# Patient Record
Sex: Male | Born: 1980 | Race: White | Hispanic: No | Smoking: Former smoker
Health system: Southern US, Community
[De-identification: ages and names within clinical notes are randomized; demographics above are authoritative.]

## PROBLEM LIST (undated history)

## (undated) DIAGNOSIS — E119 Type 2 diabetes mellitus without complications: Secondary | ICD-10-CM

## (undated) HISTORY — PX: TYMPANOSTOMY TUBE PLACEMENT: SHX32

## (undated) HISTORY — PX: MOUTH SURGERY: SHX715

---

## 2001-10-07 ENCOUNTER — Emergency Department (HOSPITAL_COMMUNITY): Admission: EM | Admit: 2001-10-07 | Discharge: 2001-10-08 | Payer: Self-pay | Admitting: Emergency Medicine

## 2001-10-08 ENCOUNTER — Encounter: Payer: Self-pay | Admitting: Emergency Medicine

## 2009-07-18 ENCOUNTER — Emergency Department (HOSPITAL_COMMUNITY): Admission: EM | Admit: 2009-07-18 | Discharge: 2009-07-18 | Payer: Self-pay | Admitting: Emergency Medicine

## 2010-08-10 HISTORY — PX: ESOPHAGOGASTRODUODENOSCOPY: SHX1529

## 2011-02-20 ENCOUNTER — Other Ambulatory Visit: Payer: Self-pay | Admitting: Gastroenterology

## 2011-02-25 ENCOUNTER — Ambulatory Visit
Admission: RE | Admit: 2011-02-25 | Discharge: 2011-02-25 | Disposition: A | Discharge status: Home / Self Care | Payer: BC Managed Care – PPO | Source: Ambulatory Visit | Attending: Gastroenterology | Admitting: Gastroenterology

## 2013-12-19 ENCOUNTER — Other Ambulatory Visit: Payer: Self-pay | Admitting: Gastroenterology

## 2013-12-19 DIAGNOSIS — R1310 Dysphagia, unspecified: Secondary | ICD-10-CM

## 2013-12-22 ENCOUNTER — Ambulatory Visit
Admission: RE | Admit: 2013-12-22 | Discharge: 2013-12-22 | Disposition: A | Discharge status: Home / Self Care | Payer: BC Managed Care – PPO | Source: Ambulatory Visit | Attending: Gastroenterology | Admitting: Gastroenterology

## 2013-12-22 DIAGNOSIS — R1310 Dysphagia, unspecified: Secondary | ICD-10-CM

## 2015-12-03 IMAGING — RF DG ESOPHAGUS
19 of 24 series · 19 of 24 positions shown · non-contrast
Comparison: None.

CLINICAL DATA: Dysphagia

EXAM:
ESOPHOGRAM / BARIUM SWALLOW / BARIUM TABLET STUDY
TECHNIQUE: Combined double contrast and single contrast examination performed
using effervescent crystals, thick barium liquid, and thin barium
liquid. The patient was observed with fluoroscopy swallowing a 13mm
barium sulphate tablet.
FLUOROSCOPY TIME:  1 min, 24 seconds

[Series 1: run · 1 of 5 slices shown (1 of 19)]
[im 1/5]
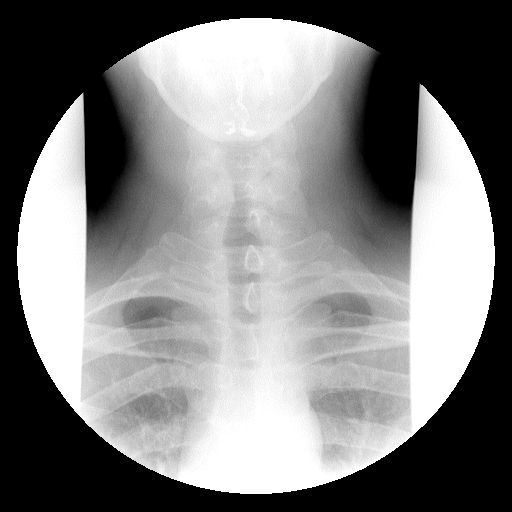

[Series 2: run · 1 of 5 slices shown (2 of 19)]
[im 1/5]
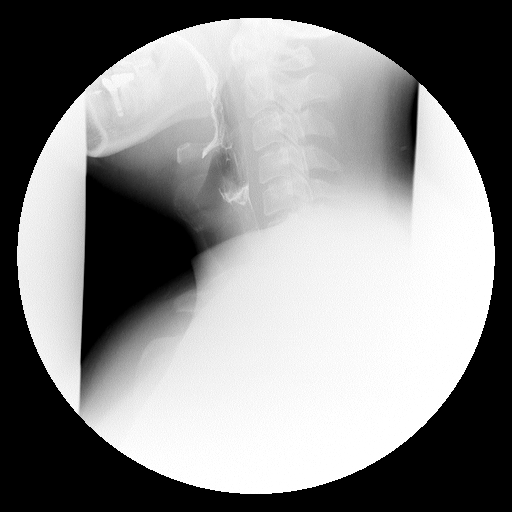

[Series 4: run · 1 of 1 slices shown (3 of 19)]
[im 1/1]
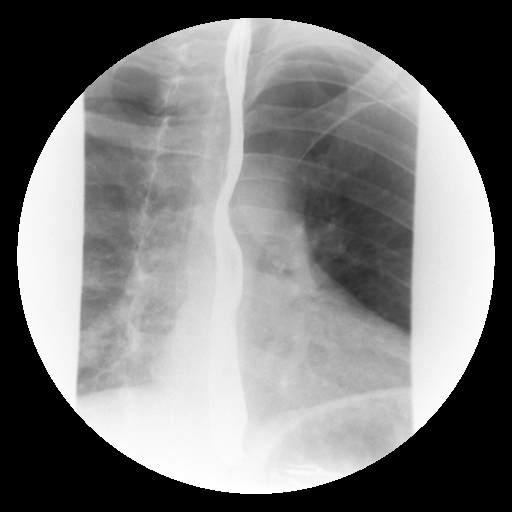

[Series 5: run · 1 of 1 slices shown (4 of 19)]
[im 1/1]
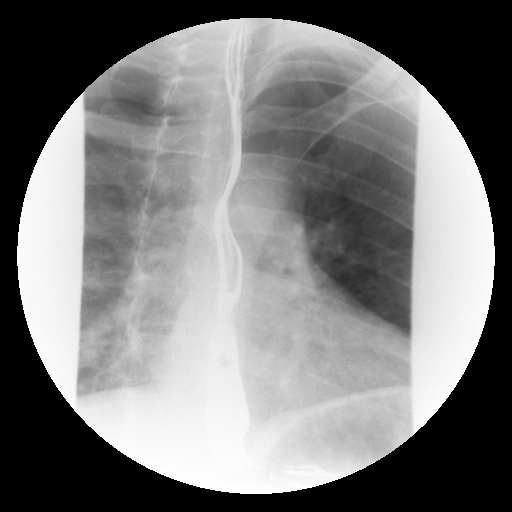

[Series 6: run · 1 of 1 slices shown (5 of 19)]
[im 1/1]
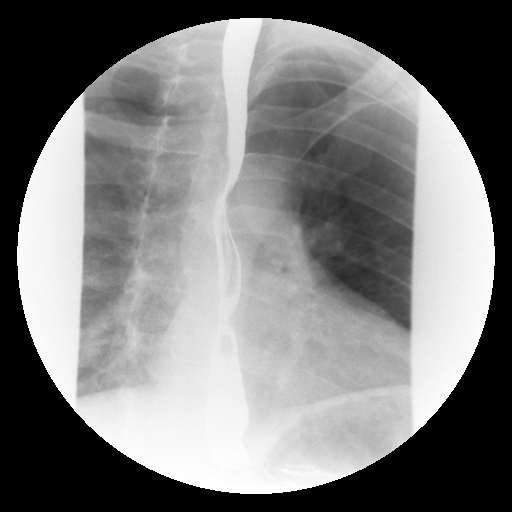

[Series 7: run · 1 of 1 slices shown (6 of 19)]
[im 1/1]
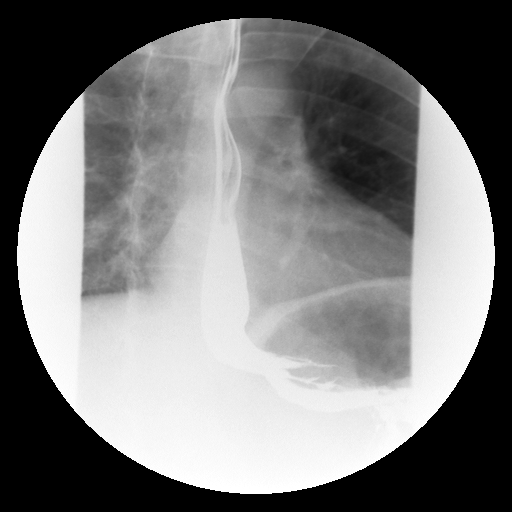

[Series 9: run · 1 of 1 slices shown (7 of 19)]
[im 1/1]
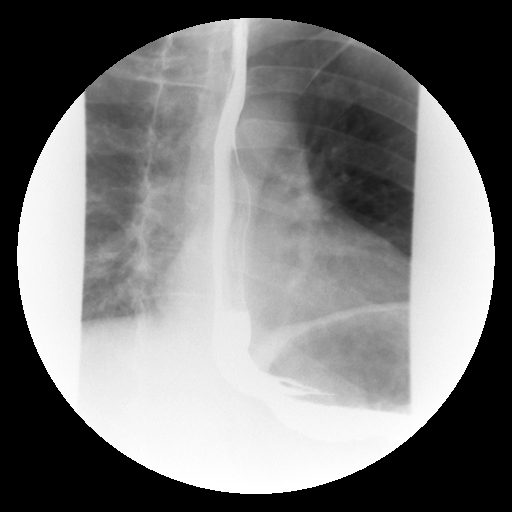

[Series 10: run · 1 of 1 slices shown (8 of 19)]
[im 1/1]
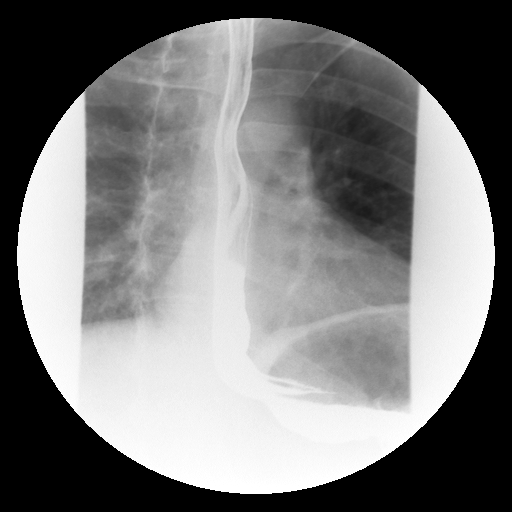

[Series 11: run · 1 of 3 slices shown (9 of 19)]
[im 1/3]
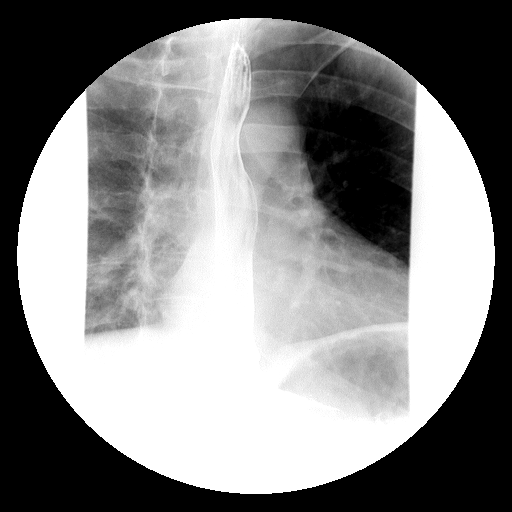

[Series 13: run · 1 of 1 slices shown (10 of 19)]
[im 1/1]
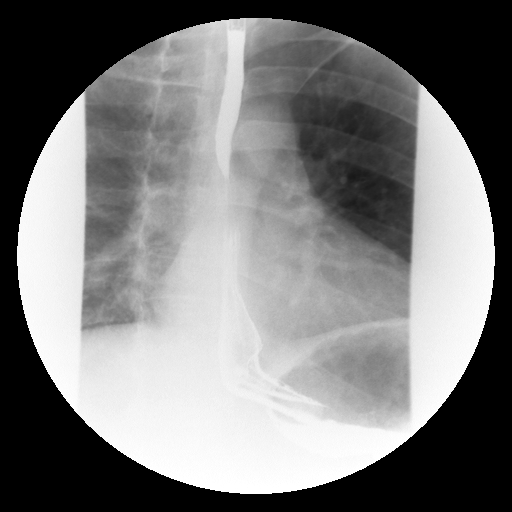

[Series 14: run · 1 of 2 slices shown (11 of 19)]
[im 1/2]
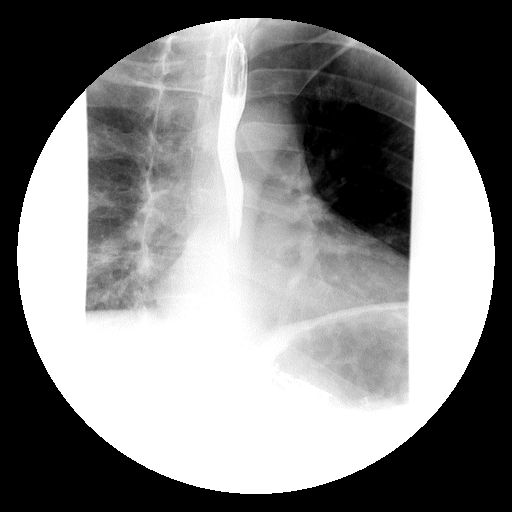

[Series 15: run · 1 of 1 slices shown (12 of 19)]
[im 1/1]
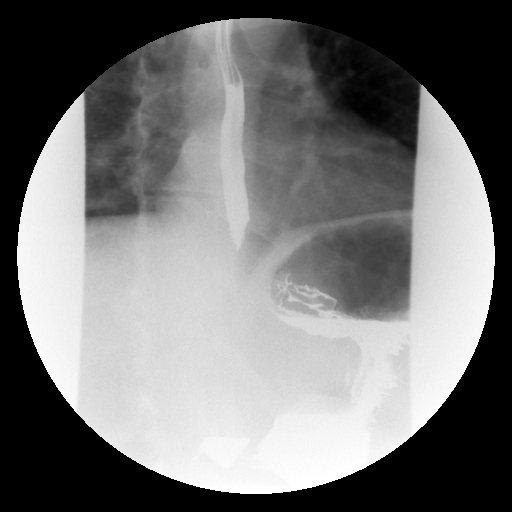

[Series 16: run · 1 of 1 slices shown (13 of 19)]
[im 1/1]
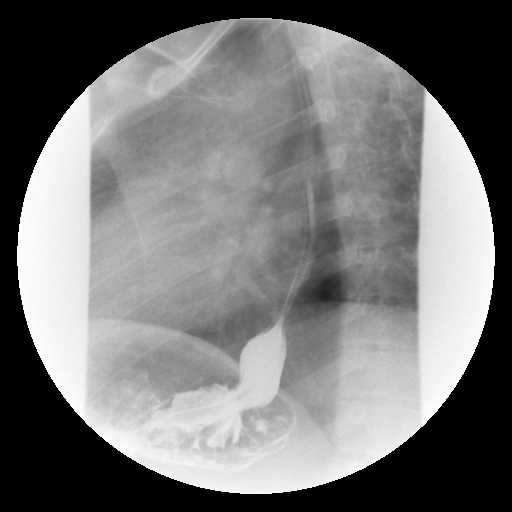

[Series 18: run · 1 of 1 slices shown (14 of 19)]
[im 1/1]
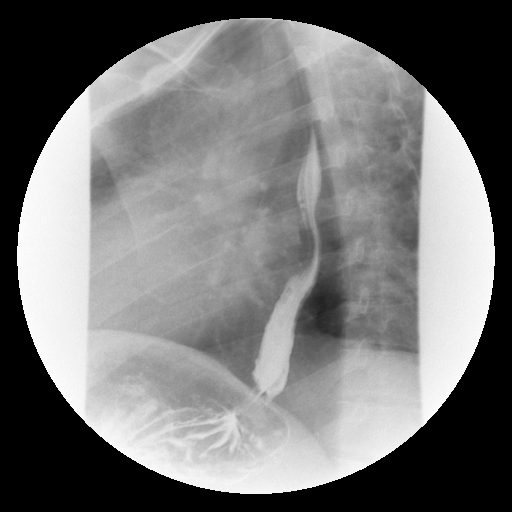

[Series 19: run · 1 of 1 slices shown (15 of 19)]
[im 1/1]
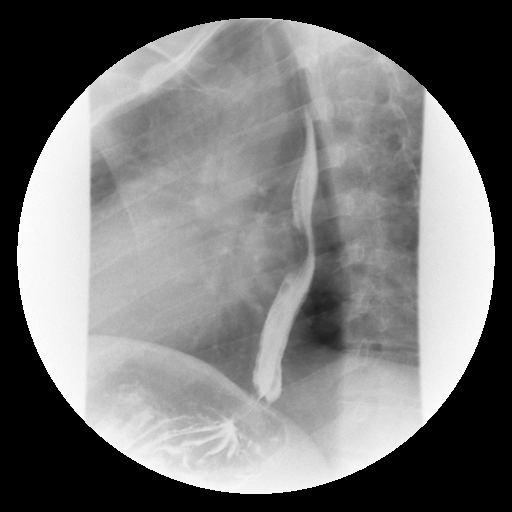

[Series 20: run · 1 of 1 slices shown (16 of 19)]
[im 1/1]
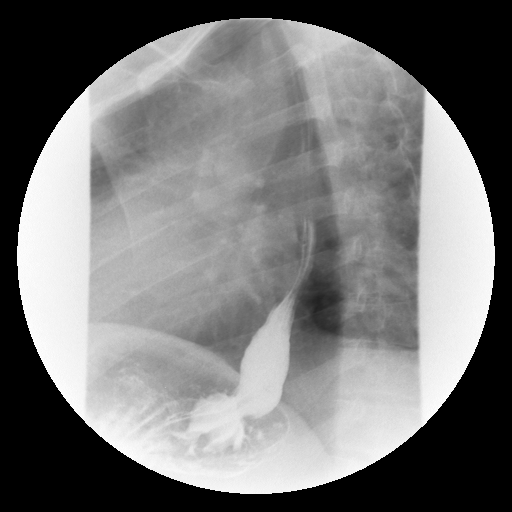

[Series 21: run · 1 of 1 slices shown (17 of 19)]
[im 1/1]
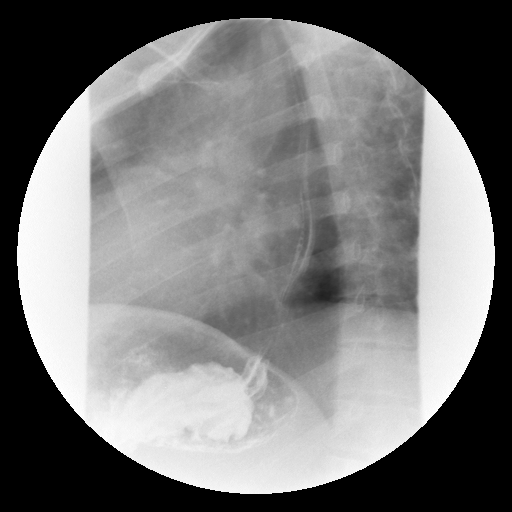

[Series 23: run · 1 of 1 slices shown (18 of 19)]
[im 1/1]
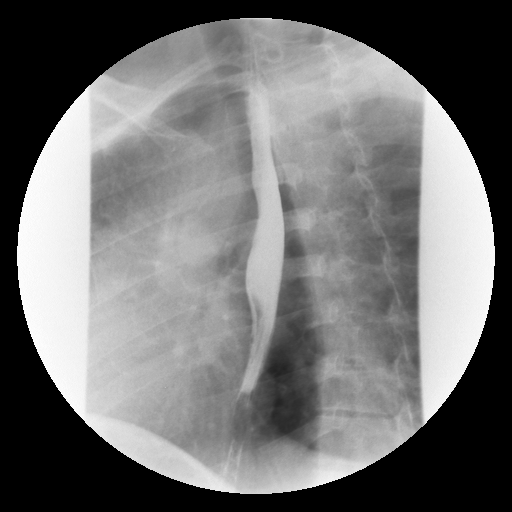

[Series 24: run · 1 of 1 slices shown (19 of 19)]
[im 1/1]
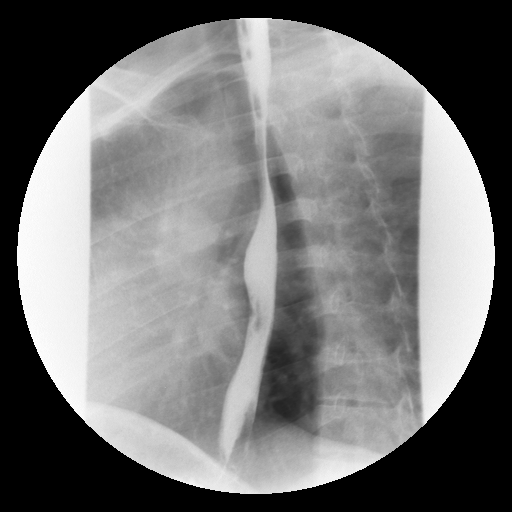

[19 of 24 positions shown; findings below may reference images not displayed]

FINDINGS: Fluoroscopic evaluation of swallowing demonstrates normal cervical
esophagus. No fixed stricture, fold thickening or mass. Normal
primary peristaltic waves. No reflux with the water siphon maneuver.
The patient swallowed a 13 mm barium tablet which freely passed into
the stomach.
IMPRESSION: Unremarkable barium swallow.

## 2019-11-23 ENCOUNTER — Ambulatory Visit: Payer: Self-pay

## 2023-06-14 ENCOUNTER — Other Ambulatory Visit: Payer: Self-pay

## 2023-06-14 ENCOUNTER — Inpatient Hospital Stay (HOSPITAL_COMMUNITY)
Admission: EM | Admit: 2023-06-14 | Discharge: 2023-06-17 | DRG: 638 | Disposition: A | Discharge status: Home / Self Care | Payer: No Typology Code available for payment source | Attending: Internal Medicine | Admitting: Internal Medicine

## 2023-06-14 ENCOUNTER — Encounter (HOSPITAL_COMMUNITY): Payer: Self-pay

## 2023-06-14 DIAGNOSIS — Z87891 Personal history of nicotine dependence: Secondary | ICD-10-CM

## 2023-06-14 DIAGNOSIS — E871 Hypo-osmolality and hyponatremia: Secondary | ICD-10-CM | POA: Diagnosis present

## 2023-06-14 DIAGNOSIS — R030 Elevated blood-pressure reading, without diagnosis of hypertension: Secondary | ICD-10-CM | POA: Diagnosis present

## 2023-06-14 DIAGNOSIS — F909 Attention-deficit hyperactivity disorder, unspecified type: Secondary | ICD-10-CM | POA: Diagnosis present

## 2023-06-14 DIAGNOSIS — H547 Unspecified visual loss: Secondary | ICD-10-CM | POA: Diagnosis present

## 2023-06-14 DIAGNOSIS — Z79899 Other long term (current) drug therapy: Secondary | ICD-10-CM

## 2023-06-14 DIAGNOSIS — E663 Overweight: Secondary | ICD-10-CM | POA: Diagnosis present

## 2023-06-14 DIAGNOSIS — E1110 Type 2 diabetes mellitus with ketoacidosis without coma: Principal | ICD-10-CM | POA: Diagnosis present

## 2023-06-14 DIAGNOSIS — Z833 Family history of diabetes mellitus: Secondary | ICD-10-CM

## 2023-06-14 DIAGNOSIS — D649 Anemia, unspecified: Secondary | ICD-10-CM | POA: Diagnosis present

## 2023-06-14 DIAGNOSIS — E1310 Other specified diabetes mellitus with ketoacidosis without coma: Principal | ICD-10-CM

## 2023-06-14 DIAGNOSIS — Z8249 Family history of ischemic heart disease and other diseases of the circulatory system: Secondary | ICD-10-CM

## 2023-06-14 DIAGNOSIS — K219 Gastro-esophageal reflux disease without esophagitis: Secondary | ICD-10-CM | POA: Diagnosis present

## 2023-06-14 DIAGNOSIS — Z6826 Body mass index (BMI) 26.0-26.9, adult: Secondary | ICD-10-CM

## 2023-06-14 DIAGNOSIS — R7989 Other specified abnormal findings of blood chemistry: Secondary | ICD-10-CM | POA: Diagnosis present

## 2023-06-14 DIAGNOSIS — N179 Acute kidney failure, unspecified: Secondary | ICD-10-CM | POA: Diagnosis present

## 2023-06-14 DIAGNOSIS — E8809 Other disorders of plasma-protein metabolism, not elsewhere classified: Secondary | ICD-10-CM | POA: Diagnosis present

## 2023-06-14 DIAGNOSIS — E876 Hypokalemia: Secondary | ICD-10-CM | POA: Diagnosis present

## 2023-06-14 HISTORY — DX: Type 2 diabetes mellitus without complications: E11.9

## 2023-06-14 LAB — BASIC METABOLIC PANEL
Anion gap: 17 — ABNORMAL HIGH (ref 5–15)
Anion gap: 12 (ref 5–15)
Anion gap: 12 (ref 5–15)
Anion gap: 9 (ref 5–15)
BUN: 13 mg/dL (ref 6–20)
BUN: 13 mg/dL (ref 6–20)
BUN: 13 mg/dL (ref 6–20)
BUN: 11 mg/dL (ref 6–20)
CO2: 13 mmol/L — ABNORMAL LOW (ref 22–32)
CO2: 14 mmol/L — ABNORMAL LOW (ref 22–32)
CO2: 14 mmol/L — ABNORMAL LOW (ref 22–32)
CO2: 15 mmol/L — ABNORMAL LOW (ref 22–32)
Calcium: 9.3 mg/dL (ref 8.9–10.3)
Calcium: 8.4 mg/dL — ABNORMAL LOW (ref 8.9–10.3)
Calcium: 8.4 mg/dL — ABNORMAL LOW (ref 8.9–10.3)
Calcium: 8.4 mg/dL — ABNORMAL LOW (ref 8.9–10.3)
Chloride: 100 mmol/L (ref 98–111)
Chloride: 105 mmol/L (ref 98–111)
Chloride: 107 mmol/L (ref 98–111)
Chloride: 111 mmol/L (ref 98–111)
Creatinine, Ser: 0.96 mg/dL (ref 0.61–1.24)
Creatinine, Ser: 1 mg/dL (ref 0.61–1.24)
Creatinine, Ser: 0.68 mg/dL (ref 0.61–1.24)
Creatinine, Ser: 0.6 mg/dL — ABNORMAL LOW (ref 0.61–1.24)
GFR, Estimated: >60 mL/min (ref >60)
GFR, Estimated: >60 mL/min (ref >60)
GFR, Estimated: >60 mL/min (ref >60)
GFR, Estimated: >60 mL/min (ref >60)
Glucose, Bld: 370 mg/dL — ABNORMAL HIGH (ref 70–99)
Glucose, Bld: 307 mg/dL — ABNORMAL HIGH (ref 70–99)
Glucose, Bld: 129 mg/dL — ABNORMAL HIGH (ref 70–99)
Glucose, Bld: 148 mg/dL — ABNORMAL HIGH (ref 70–99)
Potassium: 3.8 mmol/L (ref 3.5–5.1)
Potassium: 4 mmol/L (ref 3.5–5.1)
Potassium: 3.5 mmol/L (ref 3.5–5.1)
Potassium: 4.2 mmol/L (ref 3.5–5.1)
Sodium: 130 mmol/L — ABNORMAL LOW (ref 135–145)
Sodium: 131 mmol/L — ABNORMAL LOW (ref 135–145)
Sodium: 133 mmol/L — ABNORMAL LOW (ref 135–145)
Sodium: 135 mmol/L (ref 135–145)

## 2023-06-14 LAB — MRSA NEXT GEN BY PCR, NASAL: MRSA by PCR Next Gen: NOT DETECTED

## 2023-06-14 LAB — CBC WITH DIFFERENTIAL/PLATELET
Abs Immature Granulocytes: 0.03 10*3/uL (ref 0.00–0.07)
Basophils Absolute: 0 10*3/uL (ref 0.0–0.1)
Basophils Relative: 1 %
Eosinophils Absolute: 0.1 10*3/uL (ref 0.0–0.5)
Eosinophils Relative: 2 %
HCT: 42.6 % (ref 39.0–52.0)
Hemoglobin: 14.7 g/dL (ref 13.0–17.0)
Immature Granulocytes: 1 %
Lymphocytes Relative: 39 %
Lymphs Abs: 2.4 10*3/uL (ref 0.7–4.0)
MCH: 31.4 pg (ref 26.0–34.0)
MCHC: 34.5 g/dL (ref 30.0–36.0)
MCV: 91 fL (ref 80.0–100.0)
Monocytes Absolute: 0.4 10*3/uL (ref 0.1–1.0)
Monocytes Relative: 7 %
Neutro Abs: 3.1 10*3/uL (ref 1.7–7.7)
Neutrophils Relative %: 50 %
Platelets: 250 10*3/uL (ref 150–400)
RBC: 4.68 MIL/uL (ref 4.22–5.81)
RDW: 12.6 % (ref 11.5–15.5)
WBC: 6 10*3/uL (ref 4.0–10.5)
nRBC: 0 % (ref 0.0–0.2)

## 2023-06-14 LAB — GLUCOSE, CAPILLARY
Glucose-Capillary: 172 mg/dL — ABNORMAL HIGH (ref 70–99)
Glucose-Capillary: 138 mg/dL — ABNORMAL HIGH (ref 70–99)
Glucose-Capillary: 151 mg/dL — ABNORMAL HIGH (ref 70–99)
Glucose-Capillary: 150 mg/dL — ABNORMAL HIGH (ref 70–99)
Glucose-Capillary: 146 mg/dL — ABNORMAL HIGH (ref 70–99)
Glucose-Capillary: 147 mg/dL — ABNORMAL HIGH (ref 70–99)
Glucose-Capillary: 139 mg/dL — ABNORMAL HIGH (ref 70–99)
Glucose-Capillary: 146 mg/dL — ABNORMAL HIGH (ref 70–99)

## 2023-06-14 LAB — BLOOD GAS, VENOUS
Acid-base deficit: 11.5 mmol/L — ABNORMAL HIGH (ref 0.0–2.0)
Bicarbonate: 15 mmol/L — ABNORMAL LOW (ref 20.0–28.0)
O2 Saturation: 33.4 %
Patient temperature: 37
pCO2, Ven: 35 mm[Hg] — ABNORMAL LOW (ref 44–60)
pH, Ven: 7.24 — ABNORMAL LOW (ref 7.25–7.43)
pO2, Ven: <31 mm[Hg] — CL (ref 32–45)

## 2023-06-14 LAB — URINALYSIS, ROUTINE W REFLEX MICROSCOPIC
Bacteria, UA: NONE SEEN
Bilirubin Urine: NEGATIVE
Glucose, UA: >=500 mg/dL — AB
Ketones, ur: 80 mg/dL — AB
Leukocytes,Ua: NEGATIVE
Nitrite: NEGATIVE
Protein, ur: 30 mg/dL — AB
Specific Gravity, Urine: 1.029 (ref 1.005–1.030)
pH: 5 (ref 5.0–8.0)

## 2023-06-14 LAB — HEPATIC FUNCTION PANEL
ALT: 11 U/L (ref 0–44)
AST: 12 U/L — ABNORMAL LOW (ref 15–41)
Albumin: 3.4 g/dL — ABNORMAL LOW (ref 3.5–5.0)
Alkaline Phosphatase: 78 U/L (ref 38–126)
Bilirubin, Direct: <0.1 mg/dL (ref 0.0–0.2)
Total Bilirubin: 1.1 mg/dL (ref <1.2)
Total Protein: 6.2 g/dL — ABNORMAL LOW (ref 6.5–8.1)

## 2023-06-14 LAB — HEMOGLOBIN A1C
Hgb A1c MFr Bld: 14.5 % — ABNORMAL HIGH (ref 4.8–5.6)
Mean Plasma Glucose: 369.45 mg/dL

## 2023-06-14 LAB — BETA-HYDROXYBUTYRIC ACID
Beta-Hydroxybutyric Acid: 6.63 mmol/L — ABNORMAL HIGH (ref 0.05–0.27)
Beta-Hydroxybutyric Acid: 3.77 mmol/L — ABNORMAL HIGH (ref 0.05–0.27)

## 2023-06-14 LAB — LIPASE, BLOOD: Lipase: 35 U/L (ref 11–51)

## 2023-06-14 LAB — CBG MONITORING, ED: Glucose-Capillary: 351 mg/dL — ABNORMAL HIGH (ref 70–99)

## 2023-06-14 MED ORDER — DEXTROSE IN LACTATED RINGERS 5 % IV SOLN: INTRAVENOUS | Status: DC

## 2023-06-14 MED ORDER — ENOXAPARIN SODIUM 40 MG/0.4ML IJ SOSY
40.0000 mg | PREFILLED_SYRINGE | Freq: Every day | INTRAMUSCULAR | Status: DC
  Administered 2023-06-14 – 2023-06-16 (×3): 40 mg via SUBCUTANEOUS
  Filled 2023-06-14 (×3): qty 0.4

## 2023-06-14 MED ORDER — CHLORHEXIDINE GLUCONATE CLOTH 2 % EX PADS
6.0000 | MEDICATED_PAD | Freq: Every day | CUTANEOUS | Status: DC
  Administered 2023-06-14 – 2023-06-15 (×2): 6 via TOPICAL

## 2023-06-14 MED ORDER — LACTATED RINGERS IV BOLUS
20.0000 mL/kg | Freq: Once | INTRAVENOUS | Status: AC
  Administered 2023-06-14: 1696 mL via INTRAVENOUS

## 2023-06-14 MED ORDER — LACTATED RINGERS IV SOLN: INTRAVENOUS | Status: DC

## 2023-06-14 MED ORDER — POTASSIUM CHLORIDE CRYS ER 20 MEQ PO TBCR
40.0000 meq | EXTENDED_RELEASE_TABLET | Freq: Once | ORAL | Status: AC
  Administered 2023-06-14: 40 meq via ORAL
  Filled 2023-06-14: qty 2

## 2023-06-14 MED ORDER — LACTATED RINGERS IV SOLN: INTRAVENOUS | Status: AC

## 2023-06-14 MED ORDER — PANTOPRAZOLE SODIUM 40 MG IV SOLR
40.0000 mg | Freq: Once | INTRAVENOUS | Status: AC
  Administered 2023-06-14: 40 mg via INTRAVENOUS
  Filled 2023-06-14: qty 10

## 2023-06-14 MED ORDER — ORAL CARE MOUTH RINSE: 15.0000 mL | OROMUCOSAL | Status: DC | PRN

## 2023-06-14 MED ORDER — METOPROLOL TARTRATE 5 MG/5ML IV SOLN
5.0000 mg | Freq: Once | INTRAVENOUS | Status: AC
  Administered 2023-06-14: 5 mg via INTRAVENOUS
  Filled 2023-06-14: qty 5

## 2023-06-14 MED ORDER — INSULIN REGULAR(HUMAN) IN NACL 100-0.9 UT/100ML-% IV SOLN
INTRAVENOUS | Status: DC
  Administered 2023-06-14: 18 [IU]/h via INTRAVENOUS
  Filled 2023-06-14: qty 100

## 2023-06-14 MED ORDER — ONDANSETRON HCL 4 MG/2ML IJ SOLN: 4.0000 mg | Freq: Four times a day (QID) | INTRAMUSCULAR | Status: DC | PRN

## 2023-06-14 MED ORDER — POTASSIUM CHLORIDE 10 MEQ/100ML IV SOLN
10.0000 meq | INTRAVENOUS | Status: DC
Start: 2023-06-14 — End: 2023-06-14

## 2023-06-14 MED ORDER — DEXTROSE IN LACTATED RINGERS 5 % IV SOLN: INTRAVENOUS | Status: AC

## 2023-06-14 MED ORDER — DEXTROSE 50 % IV SOLN: 0.0000 mL | INTRAVENOUS | Status: DC | PRN

## 2023-06-14 MED ORDER — ACETAMINOPHEN 650 MG RE SUPP: 650.0000 mg | Freq: Four times a day (QID) | RECTAL | Status: DC | PRN

## 2023-06-14 MED ORDER — POTASSIUM CHLORIDE 10 MEQ/100ML IV SOLN
10.0000 meq | INTRAVENOUS | Status: DC
  Administered 2023-06-14 (×2): 10 meq via INTRAVENOUS
  Filled 2023-06-14: qty 100

## 2023-06-14 MED ORDER — PANTOPRAZOLE SODIUM 40 MG PO TBEC
40.0000 mg | DELAYED_RELEASE_TABLET | Freq: Every day | ORAL | Status: DC
  Administered 2023-06-16: 40 mg via ORAL
  Filled 2023-06-14 (×2): qty 1

## 2023-06-14 MED ORDER — ONDANSETRON HCL 4 MG PO TABS: 4.0000 mg | ORAL_TABLET | Freq: Four times a day (QID) | ORAL | Status: DC | PRN

## 2023-06-14 MED ORDER — ACETAMINOPHEN 325 MG PO TABS
650.0000 mg | ORAL_TABLET | Freq: Four times a day (QID) | ORAL | Status: DC | PRN
  Administered 2023-06-14: 650 mg via ORAL
  Filled 2023-06-14: qty 2

## 2023-06-14 NOTE — ED Triage Notes (Signed)
Sent from providers office for hyponatremia 122 on 06/10/23. Newly dx DM with A1C above 14. Not being treated yet for DM.

## 2023-06-14 NOTE — ED Provider Notes (Signed)
[place] EMERGENCY DEPARTMENT AT [facility] Provider Note   CSN: [id number] Arrival date & time: 06/14/23  1121     History  No chief complaint on file.   Warren Cuevas is a 42 y.o. male with no pertinent past medical history presented for abnormal lab.  Patient states that he went to go see his primary care few days ago and was diagnosed with diabetes and has not taken any medications for this.  Patient has over the past few weeks has had polyuria, polydipsia, polyphasia.  Patient does endorse fatigue as well.  Patient denies abdominal pain, nausea/vomiting, change in sensation of smell skills, seizure-like activity, fevers, chest pain, shortness of breath.   Home Medications Prior to Admission medications   Not on File      Allergies    Patient has no known allergies.    Review of Systems   Review of Systems  Physical Exam Updated Vital Signs BP 129/87   Pulse 88   Temp 98.3 F (36.8 C) (Oral)   Resp 13   Ht 5\' 11"  (1.803 m)   Wt 84.8 kg   SpO2 98%   BMI 26.08 kg/m  Physical Exam Vitals reviewed.  Constitutional:      General: He is not in acute distress. HENT:     Head: Normocephalic and atraumatic.     Mouth/Throat:     Mouth: Mucous membranes are dry.  Eyes:     Extraocular Movements: Extraocular movements intact.     Conjunctiva/sclera: Conjunctivae normal.     Pupils: Pupils are equal, round, and reactive to light.  Cardiovascular:     Rate and Rhythm: Normal rate and regular rhythm.     Pulses: Normal pulses.     Heart sounds: Normal heart sounds.     Comments: 2+ bilateral radial/dorsalis pedis pulses with regular rate Pulmonary:     Effort: Pulmonary effort is normal. No respiratory distress.     Breath sounds: Normal breath sounds.  Abdominal:     Palpations: Abdomen is soft.     Tenderness: There is no abdominal tenderness. There is no guarding or rebound.  Musculoskeletal:        General: Normal range of motion.      Cervical back: Normal range of motion and neck supple.     Comments: 5 out of 5 bilateral grip/leg extension strength  Skin:    General: Skin is warm and dry.     Capillary Refill: Capillary refill takes less than 2 seconds.  Neurological:     General: No focal deficit present.     Mental Status: He is alert and oriented to person, place, and time.     Comments: Sensation intact in all 4 limbs  Psychiatric:        Mood and Affect: Mood normal.     ED Results / Procedures / Treatments   Labs (all labs ordered are listed, but only abnormal results are displayed) Labs Reviewed  BASIC METABOLIC PANEL - Abnormal; Notable for the following components:      Result Value   Sodium 130 (*)    CO2 13 (*)    Glucose, Bld 370 (*)    Anion gap 17 (*)    All other components within normal limits  BLOOD GAS, VENOUS - Abnormal; Notable for the following components:   pH, Ven 7.24 (*)    pCO2, Ven 35 (*)    pO2, Ven <31 (*)    Bicarbonate 15.0 (*)  Acid-base deficit 11.5 (*)    All other components within normal limits  CBG MONITORING, ED - Abnormal; Notable for the following components:   Glucose-Capillary 351 (*)    All other components within normal limits  CBC WITH DIFFERENTIAL/PLATELET  LIPASE, BLOOD  BETA-HYDROXYBUTYRIC ACID  BETA-HYDROXYBUTYRIC ACID  URINALYSIS, ROUTINE W REFLEX MICROSCOPIC  CBG MONITORING, ED    EKG EKG Interpretation Date/Time:  Monday June 14 2023 12:08:09 EST Ventricular Rate:  82 PR Interval:  140 QRS Duration:  94 QT Interval:  378 QTC Calculation: 442 R Axis:   36  Text Interpretation: Sinus rhythm Low voltage, precordial leads Probable anteroseptal infarct, old Borderline T abnormalities, inferior leads Baseline wander in lead(s) V3 Confirmed by Kristine Royal [phone]) on 06/14/2023 12:12:56 PM  Radiology No results found.  Procedures .Critical Care  Performed by: Netta Corrigan, PA-C Authorized by: Netta Corrigan, PA-C   Critical  care provider statement:    Critical care time (minutes):  30   Critical care time was exclusive of:  Separately billable procedures and treating other patients   Critical care was necessary to treat or prevent imminent or life-threatening deterioration of the following conditions:  Metabolic crisis   Critical care was time spent personally by me on the following activities:  Development of treatment plan with patient or surrogate, discussions with consultants, evaluation of patient's response to treatment, examination of patient, ordering and review of laboratory studies, ordering and review of radiographic studies, ordering and performing treatments and interventions, pulse oximetry, re-evaluation of patient's condition, review of old charts, blood draw for specimens and obtaining history from patient or surrogate   I assumed direction of critical care for this patient from another provider in my specialty: no     Care discussed with: admitting provider       Medications Ordered in ED Medications  dextrose 50 % solution 0-50 mL (has no administration in time range)  lactated ringers bolus 1,696 mL (1,696 mLs Intravenous New Bag/Given 06/14/23 1256)    ED Course/ Medical Decision Making/ A&P                                 Medical Decision Making Amount and/or Complexity of Data Reviewed Labs: ordered.  Risk Prescription drug management.   Warren Cuevas 42 y.o. presented today for abnormal labs.  Working DDx that I considered at this time includes, but not limited to, hyperglycemia, DKA/HHS, electrolyte abnormalities, occult infection, euglycemic DKA, sepsis, hyponatremia, electrolyte abnormalities.  R/o DDx: HHS, occult infection, euglycemic DKA, sepsis, hyponatremia, electrolyte abnormalities: These are considered less likely due to history of present illness, physical exam, labs/imaging findings  Review of prior external notes: None  Unique Tests and My  Interpretation:  CBG: 351 CBC: Unremarkable CMP: Hyponatremia 130, glucose 370, anion gap 17, low CO2 13 Lipase: 35 Beta-hydroxybutyrate: pending VBG: pH 7.24 UA: pending EKG: Sinus 82 bpm, no ST elevations or depressions noted, no blocks noted  Social Determinants of Health: none  Discussion with Independent Historian:  Significant other  Discussion of Management of Tests:  Warren Matar, MD Hospitalist  Risk: High: hospitalization or escalation of hospital-level care  Risk Stratification Score: none  Staffed with Messick, MD  Plan: On exam patient was in no acute distress stable vitals.  Patient is exam does show that his oromucosa is dry but otherwise is reassuring.  Labs do show sodium 130 however glucose is 370 and so  this is most likely pseudo hyponatremia.  Patient does have anion gap of 17 which is most likely secondary to the hyperglycemia and so with patient being recently diagnosed with diabetes patient could be in DKA and so will obtain DKA labs and start fluids on the patient.  Anticipate admission if in DKA.  Patient's VBG did come back showing the patient was acidotic.  Given patient's hyperglycemia with acidosis I do have a high suspicion patient is in DKA and and will need hospital admission as he is not currently on any medications for his diabetes as he is a brand-new diabetic.  Spoke to the patient he is in agreement with admission.  Will consult hospitalist for admission.  Hospitalist accepted patient for admission.  Patient stable for admission at this time.  This chart was dictated using voice recognition software.  Despite best efforts to proofread,  errors can occur which can change the documentation meaning.         Final Clinical Impression(s) / ED Diagnoses Final diagnoses:  Diabetic ketoacidosis without coma associated with other specified diabetes mellitus [facility])    Rx / DC Orders ED Discharge Orders     None         Remi Deter 06/14/23 1340    Wynetta Fines, MD 06/14/23 1524

## 2023-06-14 NOTE — ED Notes (Signed)
ED TO INPATIENT HANDOFF REPORT  ED Nurse Name and Phone #: Cat   S Name/Age/Gender Warren Cuevas 42 y.o. male Room/Bed: WA12/WA12  Code Status   Code Status: Not on file  Home/SNF/Other Home Patient oriented to: self, place, time, and situation Is this baseline? Yes   Triage Complete: Triage complete  Chief Complaint DKA, type 2 (HCC) [E11.10]  Triage Note Sent from providers office for hyponatremia 122 on 06/10/23. Newly dx DM with A1C above 14. Not being treated yet for DM.   Allergies No Known Allergies  Level of Care/Admitting Diagnosis ED Disposition     ED Disposition  Admit   Condition  --   Comment  Hospital Area: [facility] [[id number]]  Level of Care: Stepdown [14]  Admit to SDU based on following criteria: Severe physiological/psychological symptoms:  Any diagnosis requiring assessment & intervention at least every 4 hours on an ongoing basis to obtain desired patient outcomes including stability and rehabilitation  May place patient in observation at [facility] or [facility] if equivalent level of care is available:: No  Covid Evaluation: Asymptomatic - no recent exposure (last 10 days) testing not required  Diagnosis: DKA, type 2 [facility]) [[id number]]  Admitting Physician: Bobette Mo [[id number]]  Attending Physician: Bobette Mo [[id number]]          B Medical/Surgery History Past Medical History:  Diagnosis Date   Diabetes mellitus without complication (HCC)       A IV Location/Drains/Wounds Patient Lines/Drains/Airways Status     Active Line/Drains/Airways     Name Placement date Placement time Site Days   Peripheral IV 06/14/23 20 G Anterior;Distal;Left;Upper Arm 06/14/23  1145  Arm  less than 1            Intake/Output Last 24 hours No intake or output data in the 24 hours ending 06/14/23 1348  Labs/Imaging Results for orders placed or performed during the hospital encounter of 06/14/23  (from the past 48 hour(s))  Basic metabolic panel     Status: Abnormal   Collection Time: 06/14/23 11:40 AM  Result Value Ref Range   Sodium 130 (L) 135 - 145 mmol/L   Potassium 3.8 3.5 - 5.1 mmol/L   Chloride 100 98 - 111 mmol/L   CO2 13 (L) 22 - 32 mmol/L   Glucose, Bld 370 (H) 70 - 99 mg/dL    Comment: Glucose reference range applies only to samples taken after fasting for at least 8 hours.   BUN 13 6 - 20 mg/dL   Creatinine, Ser 2.95 0.61 - 1.24 mg/dL   Calcium 9.3 8.9 - 62.1 mg/dL   GFR, Estimated >30 >86 mL/min    Comment: (NOTE) Calculated using the CKD-EPI Creatinine Equation (2021)    Anion gap 17 (H) 5 - 15    Comment: Performed at [facility], [address] [place], [place] [address]  CBC with Differential     Status: None   Collection Time: 06/14/23 11:40 AM  Result Value Ref Range   WBC 6.0 4.0 - 10.5 K/uL   RBC 4.68 4.22 - 5.81 MIL/uL   Hemoglobin 14.7 13.0 - 17.0 g/dL   HCT 96.2 95.2 - 84.1 %   MCV 91.0 80.0 - 100.0 fL   MCH 31.4 26.0 - 34.0 pg   MCHC 34.5 30.0 - 36.0 g/dL   RDW 32.4 40.1 - 02.7 %   Platelets 250 150 - 400 K/uL   nRBC 0.0 0.0 - 0.2 %  Neutrophils Relative % 50 %   Neutro Abs 3.1 1.7 - 7.7 K/uL   Lymphocytes Relative 39 %   Lymphs Abs 2.4 0.7 - 4.0 K/uL   Monocytes Relative 7 %   Monocytes Absolute 0.4 0.1 - 1.0 K/uL   Eosinophils Relative 2 %   Eosinophils Absolute 0.1 0.0 - 0.5 K/uL   Basophils Relative 1 %   Basophils Absolute 0.0 0.0 - 0.1 K/uL   Immature Granulocytes 1 %   Abs Immature Granulocytes 0.03 0.00 - 0.07 K/uL    Comment: Performed at [facility], [address], [place], [place] [address]  POC CBG, ED     Status: Abnormal   Collection Time: 06/14/23 11:40 AM  Result Value Ref Range   Glucose-Capillary 351 (H) 70 - 99 mg/dL    Comment: Glucose reference range applies only to samples taken after fasting for at least 8 hours.  Blood gas, venous     Status: Abnormal   Collection  Time: 06/14/23 12:57 PM  Result Value Ref Range   pH, Ven 7.24 (L) 7.25 - 7.43   pCO2, Ven 35 (L) 44 - 60 mmHg   pO2, Ven <31 (LL) 32 - 45 mmHg    Comment: CRITICAL RESULT CALLED TO, READ BACK BY AND VERIFIED WITH: NAPE, T RN AT 1323 06/14/23 TUCKER. J    Bicarbonate 15.0 (L) 20.0 - 28.0 mmol/L   Acid-base deficit 11.5 (H) 0.0 - 2.0 mmol/L   O2 Saturation 33.4 %   Patient temperature 37.0     Comment: Performed at [facility], [address], [place], [place] [address]  Lipase, blood     Status: None   Collection Time: 06/14/23 12:57 PM  Result Value Ref Range   Lipase 35 11 - 51 U/L    Comment: Performed at [facility], [address], [place], [place] [address]   No results found.  Pending Labs [employer] (From admission, onward)     Start     Ordered   06/14/23 1229  Beta-hydroxybutyric acid  (Diabetes Ketoacidosis (DKA))  Now then every 8 hours,   STAT (with URGENT occurrences)      06/14/23 1229   06/14/23 1229  Urinalysis, Routine w reflex microscopic -Urine, Clean Catch  (Diabetes Ketoacidosis (DKA))  ONCE - STAT,   URGENT       Question:  Specimen Source  Answer:  Urine, Clean Catch   06/14/23 1229            Vitals/Pain Today's Vitals   06/14/23 1126 06/14/23 1130 06/14/23 1200  BP: (!) 144/100  129/87  Pulse: 99  88  Resp: 16  13  Temp: 98.3 F (36.8 C)    TempSrc: Oral    SpO2: 100%  98%  Weight: 187 lb (84.8 kg)    Height: 5\' 11"  (1.803 m)    PainSc:  0-No pain     Isolation Precautions No active isolations  Medications Medications  dextrose 50 % solution 0-50 mL (has no administration in time range)  lactated ringers bolus 1,696 mL (1,696 mLs Intravenous New Bag/Given 06/14/23 1256)    Mobility walks     Focused Assessments    R Recommendations: See Admitting Provider Note  Report given to:   Additional Notes:

## 2023-06-14 NOTE — H&P (Signed)
History and Physical    Patient: Warren Cuevas DGL:[id number] DOB: 07/19/1981 DOA: 06/14/2023 DOS: the patient was seen and examined on 06/14/2023 PCP: Carin Hock, PA  Patient coming from: Home  Chief Complaint: Abnormal lab result.  HPI: Warren Cuevas is a 42 y.o. male with medical history significant of ADHD, GERD who was sent in by his primary care provider to the emergency department due to hyponatremia, hyperglycemia and A1c over 14% with a history of several weeks of polyuria, polydipsia, blurry vision and weight loss.  He also recently had a sore throat due to candidiasis and was treated with nystatin.  He denied fever, chills, rhinorrhea, wheezing or hemoptysis.  No chest pain, palpitations, diaphoresis, PND, orthopnea or pitting edema of the lower extremities.  Positive nausea, but no emesis, diarrhea, constipation, melena or hematochezia.  He gets occasional reflux.  No flank pain, dysuria, frequency or hematuria.  No polyuria, polydipsia, polyphagia or blurred vision.   Lab work: Urinalysis showed greater than 500 glucose, ketones of 80 and protein of 30 mg deciliter with small hemoglobin.  CBC was normal.  Unremarkable lipase.  Venous pH was 7.24, pCO2 35 and pO2 less than 31 mmHg.  Bicarbonate was 15 and acid-base esses 11.5 mmol/L.  BMP showed a sodium 130 and CO2 of 13 mmol/L with an anion gap of 17.  Glucose was 370.  The rest of the electrolytes and renal function were normal.  ED course: Initial vital signs were temperature 98.3 F, pulse 99, respirations 16, BP 144/100 mmHg O2 sat 100% on room air.  Patient received 1700 mL of LR bolus.   Review of Systems: As per HPI otherwise all other systems reviewed and are negative.  Past Medical History:  Diagnosis Date   ADHD (attention deficit hyperactivity disorder) 06/14/2023   Diabetes mellitus without complication (HCC)    GERD (gastroesophageal reflux disease) 06/14/2023   Past Surgical History:   Procedure Laterality Date   ESOPHAGOGASTRODUODENOSCOPY  2012   MOUTH SURGERY     TYMPANOSTOMY TUBE PLACEMENT Bilateral    Social History  reports that he has quit smoking. His smoking use included cigarettes. He has never used smokeless tobacco. He reports that he does not currently use alcohol. No history on file for drug use.  No Known Allergies  Family History  Problem Relation Age of Onset   Spina bifida Mother    CAD Father    Heart attack Father    Diabetes type II Other    Diabetes Mellitus II Other    Prior to Admission medications   Medication Sig Start Date End Date Taking? Authorizing Provider  amphetamine-dextroamphetamine (ADDERALL XR) 15 MG 24 hr capsule Take 15 mg by mouth every morning. 05/21/23  Yes [provider]  amphetamine-dextroamphetamine (ADDERALL) 15 MG tablet Take 1 tablet by mouth 2 (two) times daily. 06/02/23  Yes [provider]  clotrimazole (MYCELEX) 10 MG troche Take 10 mg by mouth 3 (three) times daily. 06/09/23  Yes [provider]  nystatin cream (MYCOSTATIN) Apply 1 Application topically 3 (three) times daily. 06/09/23  Yes [provider]   Physical Exam: Vitals:   06/14/23 1126 06/14/23 1200  BP: (!) 144/100 129/87  Pulse: 99 88  Resp: 16 13  Temp: 98.3 F (36.8 C)   TempSrc: Oral   SpO2: 100% 98%  Weight: 84.8 kg   Height: 5\' 11"  (1.803 m)    Physical Exam Vitals reviewed.  Constitutional:      General: He is awake.  He is not in acute distress.    Appearance: He is overweight. He is ill-appearing. He is not diaphoretic.  HENT:     Head: Normocephalic.     Nose: No rhinorrhea.     Mouth/Throat:     Mouth: Mucous membranes are dry.  Eyes:     General: No scleral icterus.    Pupils: Pupils are equal, round, and reactive to light.  Neck:     Vascular: No JVD.  Cardiovascular:     Rate and Rhythm: Normal rate and regular rhythm.     Heart sounds: S1 normal and S2 normal.  Pulmonary:      Effort: Pulmonary effort is normal.     Breath sounds: Normal breath sounds. No wheezing, rhonchi or rales.  Abdominal:     General: Bowel sounds are normal. There is no distension.     Palpations: Abdomen is soft.     Tenderness: There is no abdominal tenderness. There is no guarding.  Musculoskeletal:     Cervical back: Neck supple.     Right lower leg: No edema.     Left lower leg: No edema.  Skin:    General: Skin is warm.  Neurological:     General: No focal deficit present.     Mental Status: He is alert and oriented to person, place, and time.  Psychiatric:        Mood and Affect: Mood normal.        Behavior: Behavior normal. Behavior is cooperative.    Data Reviewed:  Results are pending, will review when available.  EKG: Vent. rate 82 BPM PR interval 140 ms QRS duration 94 ms QT/QTcB 378/442 ms P-R-T axes 46 36 -26 Sinus rhythm Low voltage, precordial leads Probable anteroseptal infarct, old Borderline T abnormalities, inferior leads Baseline wander in lead(s) V3O  Assessment and Plan: Principal Problem:   DKA, type 2 (HCC) Observation/stepdown. Keep NPO. Continue IV fluids. Continue insulin infusion. Monitor CBG closely. BMP every 4 hours. BHA every 8 hours. Replace electrolytes as needed. Consult diabetes coordinator. Transition to SQ insulin per Endo tool.  Active Problems:   Pseudohyponatremia Corrected sodium is 136 mg/L. Continue treatment as above.    GERD (gastroesophageal reflux disease) Pantoprazole 40 g IVP x 1 dose today. -Then 40 mg p.o. tomorrow.    ADHD (attention deficit hyperactivity disorder) Hold Adderall today.    Elevated blood pressure reading Has been very anxious. Metoprolol 5 mg IVP x 1 dose. If his states elevated: -Consider starting an ACE inhibitor.     Advance Care Planning:   Code Status: Full Code   Consults:   Family Communication: His spouse was at bedside.  Severity of Illness: The appropriate  patient status for this patient is OBSERVATION. Observation status is judged to be reasonable and necessary in order to provide the required intensity of service to ensure the patient's safety. The patient's presenting symptoms, physical exam findings, and initial radiographic and laboratory data in the context of their medical condition is felt to place them at decreased risk for further clinical deterioration. Furthermore, it is anticipated that the patient will be medically stable for discharge from the hospital within 2 midnights of admission.   Author: Bobette Mo, MD 06/14/2023 1:37 PM  For on call review [email or web address].   This document was prepared using Dragon voice recognition software and may contain some unintended transcription errors.

## 2023-06-14 NOTE — Plan of Care (Signed)
  Problem: Education: Goal: Ability to describe self-care measures that may prevent or decrease complications (Diabetes Survival Skills Education) will improve Outcome: Progressing   Problem: Coping: Goal: Ability to adjust to condition or change in health will improve Outcome: Progressing   Problem: Health Behavior/Discharge Planning: Goal: Ability to identify and utilize available resources and services will improve Outcome: Progressing   Problem: Nutritional: Goal: Maintenance of adequate nutrition will improve Outcome: Progressing Goal: Progress toward achieving an optimal weight will improve Outcome: Progressing   Problem: Education: Goal: Individualized Educational Video(s) Outcome: Progressing

## 2023-06-15 ENCOUNTER — Other Ambulatory Visit (HOSPITAL_COMMUNITY): Payer: Self-pay

## 2023-06-15 DIAGNOSIS — E871 Hypo-osmolality and hyponatremia: Secondary | ICD-10-CM | POA: Diagnosis present

## 2023-06-15 DIAGNOSIS — N179 Acute kidney failure, unspecified: Secondary | ICD-10-CM | POA: Diagnosis present

## 2023-06-15 DIAGNOSIS — R030 Elevated blood-pressure reading, without diagnosis of hypertension: Secondary | ICD-10-CM

## 2023-06-15 DIAGNOSIS — Z87891 Personal history of nicotine dependence: Secondary | ICD-10-CM | POA: Diagnosis not present

## 2023-06-15 DIAGNOSIS — Z8249 Family history of ischemic heart disease and other diseases of the circulatory system: Secondary | ICD-10-CM | POA: Diagnosis not present

## 2023-06-15 DIAGNOSIS — E663 Overweight: Secondary | ICD-10-CM | POA: Diagnosis present

## 2023-06-15 DIAGNOSIS — K219 Gastro-esophageal reflux disease without esophagitis: Secondary | ICD-10-CM | POA: Diagnosis present

## 2023-06-15 DIAGNOSIS — F909 Attention-deficit hyperactivity disorder, unspecified type: Secondary | ICD-10-CM | POA: Diagnosis present

## 2023-06-15 DIAGNOSIS — R7989 Other specified abnormal findings of blood chemistry: Secondary | ICD-10-CM

## 2023-06-15 DIAGNOSIS — D649 Anemia, unspecified: Secondary | ICD-10-CM | POA: Diagnosis present

## 2023-06-15 DIAGNOSIS — Z833 Family history of diabetes mellitus: Secondary | ICD-10-CM | POA: Diagnosis not present

## 2023-06-15 DIAGNOSIS — E1110 Type 2 diabetes mellitus with ketoacidosis without coma: Secondary | ICD-10-CM | POA: Diagnosis present

## 2023-06-15 DIAGNOSIS — E8809 Other disorders of plasma-protein metabolism, not elsewhere classified: Secondary | ICD-10-CM | POA: Diagnosis present

## 2023-06-15 DIAGNOSIS — E876 Hypokalemia: Secondary | ICD-10-CM | POA: Diagnosis present

## 2023-06-15 DIAGNOSIS — Z6826 Body mass index (BMI) 26.0-26.9, adult: Secondary | ICD-10-CM | POA: Diagnosis not present

## 2023-06-15 DIAGNOSIS — H547 Unspecified visual loss: Secondary | ICD-10-CM | POA: Diagnosis present

## 2023-06-15 DIAGNOSIS — Z79899 Other long term (current) drug therapy: Secondary | ICD-10-CM | POA: Diagnosis not present

## 2023-06-15 LAB — COMPREHENSIVE METABOLIC PANEL
ALT: 11 U/L (ref 0–44)
ALT: 12 U/L (ref 0–44)
ALT: 15 U/L (ref 0–44)
AST: 10 U/L — ABNORMAL LOW (ref 15–41)
AST: 11 U/L — ABNORMAL LOW (ref 15–41)
AST: 13 U/L — ABNORMAL LOW (ref 15–41)
Albumin: 3.1 g/dL — ABNORMAL LOW (ref 3.5–5.0)
Albumin: 3 g/dL — ABNORMAL LOW (ref 3.5–5.0)
Albumin: 3.3 g/dL — ABNORMAL LOW (ref 3.5–5.0)
Alkaline Phosphatase: 52 U/L (ref 38–126)
Alkaline Phosphatase: 51 U/L (ref 38–126)
Alkaline Phosphatase: 55 U/L (ref 38–126)
Anion gap: 9 (ref 5–15)
Anion gap: 7 (ref 5–15)
Anion gap: 9 (ref 5–15)
BUN: 11 mg/dL (ref 6–20)
BUN: 9 mg/dL (ref 6–20)
BUN: 7 mg/dL (ref 6–20)
CO2: 18 mmol/L — ABNORMAL LOW (ref 22–32)
CO2: 21 mmol/L — ABNORMAL LOW (ref 22–32)
CO2: 21 mmol/L — ABNORMAL LOW (ref 22–32)
Calcium: 8.4 mg/dL — ABNORMAL LOW (ref 8.9–10.3)
Calcium: 8.4 mg/dL — ABNORMAL LOW (ref 8.9–10.3)
Calcium: 8.5 mg/dL — ABNORMAL LOW (ref 8.9–10.3)
Chloride: 107 mmol/L (ref 98–111)
Chloride: 108 mmol/L (ref 98–111)
Chloride: 103 mmol/L (ref 98–111)
Creatinine, Ser: 0.68 mg/dL (ref 0.61–1.24)
Creatinine, Ser: 0.5 mg/dL — ABNORMAL LOW (ref 0.61–1.24)
Creatinine, Ser: 0.68 mg/dL (ref 0.61–1.24)
GFR, Estimated: >60 mL/min (ref >60)
GFR, Estimated: >60 mL/min (ref >60)
GFR, Estimated: >60 mL/min (ref >60)
Glucose, Bld: 154 mg/dL — ABNORMAL HIGH (ref 70–99)
Glucose, Bld: 130 mg/dL — ABNORMAL HIGH (ref 70–99)
Glucose, Bld: 172 mg/dL — ABNORMAL HIGH (ref 70–99)
Potassium: 2.9 mmol/L — ABNORMAL LOW (ref 3.5–5.1)
Potassium: 3.5 mmol/L (ref 3.5–5.1)
Potassium: 3.1 mmol/L — ABNORMAL LOW (ref 3.5–5.1)
Sodium: 134 mmol/L — ABNORMAL LOW (ref 135–145)
Sodium: 136 mmol/L (ref 135–145)
Sodium: 133 mmol/L — ABNORMAL LOW (ref 135–145)
Total Bilirubin: 0.8 mg/dL (ref <1.2)
Total Bilirubin: 1.1 mg/dL (ref <1.2)
Total Bilirubin: 0.9 mg/dL (ref <1.2)
Total Protein: 5.8 g/dL — ABNORMAL LOW (ref 6.5–8.1)
Total Protein: 5.6 g/dL — ABNORMAL LOW (ref 6.5–8.1)
Total Protein: 6.1 g/dL — ABNORMAL LOW (ref 6.5–8.1)

## 2023-06-15 LAB — PHOSPHORUS: Phosphorus: 1.3 mg/dL — ABNORMAL LOW (ref 2.5–4.6)

## 2023-06-15 LAB — CBC
HCT: 34 % — ABNORMAL LOW (ref 39.0–52.0)
Hemoglobin: 11.9 g/dL — ABNORMAL LOW (ref 13.0–17.0)
MCH: 31.7 pg (ref 26.0–34.0)
MCHC: 35 g/dL (ref 30.0–36.0)
MCV: 90.7 fL (ref 80.0–100.0)
Platelets: 206 10*3/uL (ref 150–400)
RBC: 3.75 MIL/uL — ABNORMAL LOW (ref 4.22–5.81)
RDW: 12.6 % (ref 11.5–15.5)
WBC: 5.6 10*3/uL (ref 4.0–10.5)
nRBC: 0 % (ref 0.0–0.2)

## 2023-06-15 LAB — GLUCOSE, CAPILLARY
Glucose-Capillary: 126 mg/dL — ABNORMAL HIGH (ref 70–99)
Glucose-Capillary: 139 mg/dL — ABNORMAL HIGH (ref 70–99)
Glucose-Capillary: 142 mg/dL — ABNORMAL HIGH (ref 70–99)
Glucose-Capillary: 144 mg/dL — ABNORMAL HIGH (ref 70–99)
Glucose-Capillary: 150 mg/dL — ABNORMAL HIGH (ref 70–99)
Glucose-Capillary: 151 mg/dL — ABNORMAL HIGH (ref 70–99)
Glucose-Capillary: 152 mg/dL — ABNORMAL HIGH (ref 70–99)
Glucose-Capillary: 152 mg/dL — ABNORMAL HIGH (ref 70–99)
Glucose-Capillary: 158 mg/dL — ABNORMAL HIGH (ref 70–99)
Glucose-Capillary: 160 mg/dL — ABNORMAL HIGH (ref 70–99)
Glucose-Capillary: 160 mg/dL — ABNORMAL HIGH (ref 70–99)
Glucose-Capillary: 171 mg/dL — ABNORMAL HIGH (ref 70–99)
Glucose-Capillary: 174 mg/dL — ABNORMAL HIGH (ref 70–99)
Glucose-Capillary: 175 mg/dL — ABNORMAL HIGH (ref 70–99)
Glucose-Capillary: 185 mg/dL — ABNORMAL HIGH (ref 70–99)
Glucose-Capillary: 221 mg/dL — ABNORMAL HIGH (ref 70–99)
Glucose-Capillary: 318 mg/dL — ABNORMAL HIGH (ref 70–99)

## 2023-06-15 LAB — MAGNESIUM: Magnesium: 2 mg/dL (ref 1.7–2.4)

## 2023-06-15 LAB — BETA-HYDROXYBUTYRIC ACID
Beta-Hydroxybutyric Acid: 3.1 mmol/L — ABNORMAL HIGH (ref 0.05–0.27)
Beta-Hydroxybutyric Acid: 2.83 mmol/L — ABNORMAL HIGH (ref 0.05–0.27)
Beta-Hydroxybutyric Acid: 2.7 mmol/L — ABNORMAL HIGH (ref 0.05–0.27)

## 2023-06-15 LAB — HIV ANTIBODY (ROUTINE TESTING W REFLEX): HIV Screen 4th Generation wRfx: NONREACTIVE

## 2023-06-15 MED ORDER — POTASSIUM CHLORIDE 20 MEQ PO PACK
60.0000 meq | PACK | Freq: Once | ORAL | Status: AC
  Administered 2023-06-15: 60 meq via ORAL
  Filled 2023-06-15: qty 3

## 2023-06-15 MED ORDER — INSULIN GLARGINE-YFGN 100 UNIT/ML ~~LOC~~ SOLN: 15.0000 [IU] | Freq: Every day | SUBCUTANEOUS | Status: DC

## 2023-06-15 MED ORDER — INSULIN ASPART 100 UNIT/ML IJ SOLN
0.0000 [IU] | Freq: Three times a day (TID) | INTRAMUSCULAR | Status: DC
  Administered 2023-06-15 – 2023-06-16 (×3): 2 [IU] via SUBCUTANEOUS
  Administered 2023-06-16: 1 [IU] via SUBCUTANEOUS
  Administered 2023-06-17: 3 [IU] via SUBCUTANEOUS

## 2023-06-15 MED ORDER — SODIUM PHOSPHATES 45 MMOLE/15ML IV SOLN
30.0000 mmol | Freq: Once | INTRAVENOUS | Status: AC
  Administered 2023-06-15: 30 mmol via INTRAVENOUS
  Filled 2023-06-15: qty 10

## 2023-06-15 MED ORDER — LIVING WELL WITH DIABETES BOOK
Freq: Once | Status: AC
  Filled 2023-06-15: qty 1

## 2023-06-15 MED ORDER — INSULIN GLARGINE-YFGN 100 UNIT/ML ~~LOC~~ SOLN
15.0000 [IU] | Freq: Every day | SUBCUTANEOUS | Status: DC
  Administered 2023-06-15 – 2023-06-16 (×2): 15 [IU] via SUBCUTANEOUS
  Filled 2023-06-15 (×2): qty 0.15

## 2023-06-15 MED ORDER — INSULIN ASPART 100 UNIT/ML IJ SOLN
4.0000 [IU] | Freq: Three times a day (TID) | INTRAMUSCULAR | Status: DC
  Administered 2023-06-16 – 2023-06-17 (×4): 4 [IU] via SUBCUTANEOUS

## 2023-06-15 MED ORDER — INSULIN STARTER KIT- PEN NEEDLES (ENGLISH)
1.0000 | Freq: Once | Status: AC
  Administered 2023-06-15: 1
  Filled 2023-06-15: qty 1

## 2023-06-15 MED ORDER — POTASSIUM CHLORIDE 10 MEQ/100ML IV SOLN
10.0000 meq | INTRAVENOUS | Status: AC
  Administered 2023-06-15 (×4): 10 meq via INTRAVENOUS
  Filled 2023-06-15 (×3): qty 100

## 2023-06-15 MED ORDER — INSULIN ASPART 100 UNIT/ML IJ SOLN
0.0000 [IU] | Freq: Every day | INTRAMUSCULAR | Status: DC
  Administered 2023-06-15 – 2023-06-16 (×2): 2 [IU] via SUBCUTANEOUS

## 2023-06-15 MED ORDER — POTASSIUM CHLORIDE CRYS ER 20 MEQ PO TBCR
40.0000 meq | EXTENDED_RELEASE_TABLET | Freq: Two times a day (BID) | ORAL | Status: AC
  Administered 2023-06-15 – 2023-06-16 (×2): 40 meq via ORAL
  Filled 2023-06-15 (×2): qty 2

## 2023-06-15 MED ORDER — INSULIN ASPART 100 UNIT/ML IJ SOLN: 0.0000 [IU] | Freq: Three times a day (TID) | INTRAMUSCULAR | Status: DC

## 2023-06-15 NOTE — TOC Benefit Eligibility Note (Signed)
Patient Product/process development scientist completed.    The patient is insured through [facility]. Patient has [employer], may use a copay card, and/or apply for patient assistance if available.    Ran test claim for Lantus Pen and the current 30 day co-pay is $40.00.  Ran test claim for Novolog FlexPen and the current 30 day co-pay is $40.00.  Ran test claim for Dexcom G7 Sensor and the current 30 day co-pay is $73.61.  Ran test claim for [employer] and not covered  This test claim was processed through [employer]- copay amounts may vary at other pharmacies due to [employer], or as the patient moves through the different stages of their insurance plan.     Warren Cuevas, CPHT Pharmacy Technician III Certified Patient Advocate [facility] Patient Advocate Team Direct Number: [phone]  Fax: [phone]

## 2023-06-15 NOTE — Progress Notes (Signed)
PROGRESS NOTE    Warren Cuevas  WGN:[id number] DOB: 21-Mar-1981 DOA: 06/14/2023 PCP: Carin Hock, PA   Brief Narrative:  The patient is a 42 year old Caucasian male with a past medical history significant for but not limited to ADHD, GERD as well as other comorbidities who was sent to the ED by his primary care physician due to hyponatremia and hyperglycemia with an A1c of over 14 with several weeks of history of polyuria, polydipsia, blurred vision loss.  Found to have DKA and admitted and placed on insulin drip.  Currently his gap is improving and beta hydroxybutyrate acid is slowing improving.  Will transition to long-acting insulin.  Assessment and Plan:  DKA, type 2 (HCC) with Hyperglycemia -Observation/stepdown and will change to Inpatient  -Keep NPO. -Continue IV fluids. -Continue insulin infusion until GAP is closed and has CO2 x2 -Continue to Monitor CBG Trend: Recent Labs  Lab 06/15/23 0806 06/15/23 0904 06/15/23 1002 06/15/23 1159 06/15/23 1330 06/15/23 1444 06/15/23 1547  GLUCAP 139* 144* 150* 126* 152* 174* 185*  -C/w BMP every 4 hours and BHA every 8 hours. -HbA1c was significantly uncontrolled at 14.5 -Beta-Hydroxybutyric Acid Trend went from 6.63 -> 3.77 -> 3.10 -> 2.83 -Replace electrolytes as needed. -Consult diabetes coordinator. -Will discontinue insulin drip 1 to 2 hours after Semglee was given -Transition to SQ insulin per Endo tool once Ready and will transition to Semglee 15 units to 24 as well as sensitive NovoLog-insulin before meals and at bedtime and once he is fully eating will add 4 units of 3 times daily meal coverage  Metabolic Acidosis -Patient's CO2 is now 21, AG is 7, and Chloride Level is 108 -Continue to Monitor and Trend and repeat BMP in 4 hours and CMP in the AM   ADHD (attention deficit hyperactivity disorder) -Hold Adderall today.   Elevated blood pressure reading -Has been very anxious. -Metoprolol 5 mg IVP x 1  dose. -Will Consider starting an ACE inhibitor prior to D/C -Continue to Monitor and Trend  Hypokalemia -Patient's K+ Level Trend: Recent Labs  Lab 06/14/23 1140 06/14/23 1503 06/14/23 2031 06/14/23 2313 06/15/23 0420 06/15/23 0901  K 3.8 4.0 3.5 4.2 2.9* 3.5  -Replete with po Kcl 60 mEQ x1 and IV Kcl 40 mEQ -Continue to Monitor and Replete as Necessary -Repeat CMP in the AM   Hypophosphatemia -Phos Level Trend: Recent Labs  Lab 06/15/23 0901  PHOS 1.3*  -Replete with IV Na+Phos 30 mmol -Continue to Monitor and Replete as Necessary -Repeat Phos Level in the AM  Hyponatremia Pseudohyponatremia  -Na+ Trend: Recent Labs  Lab 06/14/23 1140 06/14/23 1503 06/14/23 2031 06/14/23 2313 06/15/23 0420 06/15/23 0901  NA 130* 131* 133* 135 134* 136  -Continue to Monitor and Trend and repeat CMP in the AM  Normocytic Anemia -Hgb/Hct Trend: Recent Labs  Lab 06/14/23 1140 06/15/23 0420  HGB 14.7 11.9*  HCT 42.6 34.0*  MCV 91.0 90.7  -Likely Hemoconcentrated on Admission; Check Anemia Panel in the AM -Continue to Monitor for S/Sx of Bleeding; No overt bleeding noted -Repeat CBC in the AM  GERD/GI Prophylaxis -Given IV PPI with Pantoprazole 40 mg x1 and now started on po Pantoprazole 40 mg po Daily   Hypoalbuminemia -Patient's Albumin Trend: Recent Labs  Lab 06/14/23 1503 06/15/23 0420 06/15/23 0901  ALBUMIN 3.4* 3.1* 3.0*  -Continue to Monitor and Trend and repeat CMP in the AM  Overweight -Complicates overall prognosis and care -Estimated body mass index is 26.08 kg/m as calculated from the  following:   Height as of this encounter: 5\' 11"  (1.803 m).   Weight as of this encounter: 84.8 kg.  -Weight Loss and Dietary Counseling given   DVT prophylaxis: enoxaparin (LOVENOX) injection 40 mg Start: 06/14/23 1445    Code Status: Full Code Family Communication: No family currently at bedside  Disposition Plan:  Level of care: Stepdown Status is:  Observation The patient will require care spanning > 2 midnights and should be moved to inpatient because: Continues to be on insulin drip and will need to be transition to long-acting insulin   Consultants:  Diabetes education coordinator  Procedures:  As delineated as above  Antimicrobials:  Anti-infectives (From admission, onward)    None       Subjective: Seen and examined at bedside and states that he had some nausea and vomiting and had some polydipsia prior to admission.  States that he is feeling overall better.  States he is little bit hungry.  No other concerns or points this time.  Objective: Vitals:   06/15/23 1200 06/15/23 1300 06/15/23 1400 06/15/23 1500  BP: 132/81     Pulse: 72 79 72 78  Resp: 14 11 13 15   Temp: 98.7 F (37.1 C)     TempSrc: Oral     SpO2: 97% 99% 97% 98%  Weight:      Height:        Intake/Output Summary (Last 24 hours) at 06/15/2023 1636 Last data filed at 06/15/2023 0600 Gross per 24 hour  Intake 2071.23 ml  Output 0 ml  Net 2071.23 ml   Filed Weights   06/14/23 1126  Weight: 84.8 kg   Examination: Physical Exam:  Constitutional: WN/WD overweight Caucasian male in no acute distress Respiratory: Diminished to auscultation bilaterally, no wheezing, rales, rhonchi or crackles. Normal respiratory effort and patient is not tachypenic. No accessory muscle use.  Unlabored breathing Cardiovascular: RRR, no murmurs / rubs / gallops. S1 and S2 auscultated. No extremity edema Abdomen: Soft, non-tender, distended secondary to body habitus. Bowel sounds positive.  GU: Deferred. Musculoskeletal: No clubbing / cyanosis of digits/nails. No joint deformity upper and lower extremities Skin: No rashes, lesions, ulcers on limited skin evaluation. No induration; Warm and dry.  Neurologic: CN 2-12 grossly intact with no focal deficits. Romberg sign and cerebellar reflexes not assessed.  Psychiatric: Normal judgment and insight. Alert and oriented x  3. Normal mood and appropriate affect.   Data Reviewed: I have personally reviewed following labs and imaging studies  CBC: Recent Labs  Lab 06/14/23 1140 06/15/23 0420  WBC 6.0 5.6  NEUTROABS 3.1  --   HGB 14.7 11.9*  HCT 42.6 34.0*  MCV 91.0 90.7  PLT 250 206   Basic Metabolic Panel: Recent Labs  Lab 06/14/23 1503 06/14/23 2031 06/14/23 2313 06/15/23 0420 06/15/23 0901  NA 131* 133* 135 134* 136  K 4.0 3.5 4.2 2.9* 3.5  CL 105 107 111 107 108  CO2 14* 14* 15* 18* 21*  GLUCOSE 307* 129* 148* 154* 130*  BUN 13 13 11 11 9   CREATININE 1.00 0.68 0.60* 0.68 0.50*  CALCIUM 8.4* 8.4* 8.4* 8.4* 8.4*  MG  --   --   --   --  2.0  PHOS  --   --   --   --  1.3*   GFR: Estimated Creatinine Clearance: 129.4 mL/min (A) (by C-G formula based on SCr of 0.5 mg/dL (L)). Liver Function Tests: Recent Labs  Lab 06/14/23 1503 06/15/23 0420 06/15/23 0901  AST 12* 10* 11*  ALT 11 11 12   ALKPHOS 78 52 51  BILITOT 1.1 0.8 1.1  PROT 6.2* 5.8* 5.6*  ALBUMIN 3.4* 3.1* 3.0*   Recent Labs  Lab 06/14/23 1257  LIPASE 35   No results for input(s): "AMMONIA" in the last 168 hours. Coagulation Profile: No results for input(s): "INR", "PROTIME" in the last 168 hours. Cardiac Enzymes: No results for input(s): "CKTOTAL", "CKMB", "CKMBINDEX", "TROPONINI" in the last 168 hours. BNP (last 3 results) No results for input(s): "PROBNP" in the last 8760 hours. HbA1C: Recent Labs    06/14/23 1503  HGBA1C 14.5*   CBG: Recent Labs  Lab 06/15/23 1002 06/15/23 1159 06/15/23 1330 06/15/23 1444 06/15/23 1547  GLUCAP 150* 126* 152* 174* 185*   Lipid Profile: No results for input(s): "CHOL", "HDL", "LDLCALC", "TRIG", "CHOLHDL", "LDLDIRECT" in the last 72 hours. Thyroid Function Tests: No results for input(s): "TSH", "T4TOTAL", "FREET4", "T3FREE", "THYROIDAB" in the last 72 hours. Anemia Panel: No results for input(s): "VITAMINB12", "FOLATE", "FERRITIN", "TIBC", "IRON", "RETICCTPCT" in the  last 72 hours. Sepsis Labs: No results for input(s): "PROCALCITON", "LATICACIDVEN" in the last 168 hours.  Recent Results (from the past 240 hour(s))  MRSA Next Gen by PCR, Nasal     Status: None   Collection Time: 06/14/23  2:54 PM   Specimen: Nasal Mucosa; Nasal Swab  Result Value Ref Range Status   MRSA by PCR Next Gen NOT DETECTED NOT DETECTED Final    Comment: (NOTE) The GeneXpert MRSA Assay (FDA approved for NASAL specimens only), is one component of a comprehensive MRSA colonization surveillance program. It is not intended to diagnose MRSA infection nor to guide or monitor treatment for MRSA infections. Test performance is not FDA approved in patients less than 63 years old. Performed at [facility], [address] [place], [place] [address]     Radiology Studies: No results found.  Scheduled Meds:  Chlorhexidine Gluconate Cloth  6 each Topical Daily   enoxaparin (LOVENOX) injection  40 mg Subcutaneous Daily   pantoprazole  40 mg Oral Daily   Continuous Infusions:  insulin 2.6 Units/hr (06/15/23 0600)   sodium phosphate 30 mmol in dextrose 5 % 250 mL infusion 30 mmol (06/15/23 1605)    LOS: 0 days   Marguerita Merles, DO [facility] Available via Epic secure chat 7am-7pm After these hours, please refer to coverage provider listed on [email or web address] 06/15/2023, 4:36 PM

## 2023-06-15 NOTE — Inpatient Diabetes Management (Signed)
Inpatient Diabetes Program Recommendations  AACE/ADA: New Consensus Statement on Inpatient Glycemic Control (2015)  Target Ranges:  Prepandial:   less than 140 mg/dL      Peak postprandial:   less than 180 mg/dL (1-2 hours)      Critically ill patients:  140 - 180 mg/dL   Lab Results  Component Value Date   GLUCAP 150 (H) 06/15/2023   HGBA1C 14.5 (H) 06/14/2023    Review of Glycemic Control  Diabetes history: DM2 - recently diagnosed Outpatient Diabetes medications: None Current orders for Inpatient glycemic control: IV insulin per EndoTool for DKA  HgbA1C - 14.5% BHB - still elevated at 2.83  Inpatient Diabetes Program Recommendations:    Continue IV insulin until criteria met for discontinuation of drip  Give Semglee 1-2 hours prior to discontinuation of drip  Semglee 12-15 units Q24H  Novolog 0-9 units TID with meals and 0-5 HS  When eating well, may need meal coverage insulin - 4 units TID with meals  Spoke with pt at bedside regarding new-onset DM and HgbA1C of 14.5%. explained what an A1C is and informed patient that his current A1C indicates an average glucose of 240 mg/dl over the past 2-3 months. Discussed basic pathophysiology of DM Type 2, basic home care, importance of checking CBGs and maintaining good CBG control to prevent long-term and short-term complications. Reviewed glucose and A1C goals and explained that patient will need to continue to  Reviewed signs and symptoms of hyperglycemia and hypoglycemia along with treatment for both. Discussed impact of nutrition, exercise, stress, sickness, and medications on diabetes control. Reviewed Living Well with diabetes booklet and encouraged patient to read through entire book.  Educated patient and spouse on insulin pen use at home. Reviewed contents of insulin flexpen starter kit. Reviewed all steps if insulin pen including attachment of needle, 2-unit air shot, dialing up dose, giving injection, removing needle,  disposal of sharps, storage of unused insulin, disposal of insulin etc. Patient able to provide successful return demonstration. Also reviewed troubleshooting with insulin pen. MD to give patient Rxs for insulin pens and insulin pen needles. Discussed importance of f/u with PCP and wants to see Endo in OP setting.  Will f/u in am.   Thank you. Ailene Ards, RD, LDN, CDCES Inpatient Diabetes Coordinator [phone]

## 2023-06-15 NOTE — Hospital Course (Addendum)
The patient is a 42 year old Caucasian male with a past medical history significant for but not limited to ADHD, GERD as well as other comorbidities who was sent to the ED by his primary care physician due to hyponatremia and hyperglycemia with an A1c of over 14 with several weeks of history of polyuria, polydipsia, blurred vision loss.  Found to have DKA and admitted and placed on insulin drip.  Currently his gap is improving and beta hydroxybutyrate acid is slowing improving.  Will transition to long-acting insulin.  Assessment and Plan:  DKA, type 2 (HCC) with Hyperglycemia -Observation/stepdown and will change to Inpatient  -Keep NPO. -Continue IV fluids. -Continue insulin infusion until GAP is closed and has CO2 x2 -Continue to Monitor CBG Trend: Recent Labs  Lab 06/15/23 0806 06/15/23 0904 06/15/23 1002 06/15/23 1159 06/15/23 1330 06/15/23 1444 06/15/23 1547  GLUCAP 139* 144* 150* 126* 152* 174* 185*  -C/w BMP every 4 hours and BHA every 8 hours. -HbA1c was significantly uncontrolled at 14.5 -Beta-Hydroxybutyric Acid Trend went from 6.63 -> 3.77 -> 3.10 -> 2.83 -Replace electrolytes as needed. -Consult diabetes coordinator. -Will discontinue insulin drip 1 to 2 hours after Semglee was given -Transition to SQ insulin per Endo tool once Ready and will transition to Semglee 15 units to 24 as well as sensitive NovoLog-insulin before meals and at bedtime and once he is fully eating will add 4 units of 3 times daily meal coverage  Metabolic Acidosis -Patient's CO2 is now 21, AG is 7, and Chloride Level is 108 -Continue to Monitor and Trend and repeat BMP in 4 hours and CMP in the AM   ADHD (attention deficit hyperactivity disorder) -Hold Adderall today.   Elevated blood pressure reading -Has been very anxious. -Metoprolol 5 mg IVP x 1 dose. -Will Consider starting an ACE inhibitor prior to D/C -Continue to Monitor and Trend  Hypokalemia -Patient's K+ Level Trend: Recent  Labs  Lab 06/14/23 1140 06/14/23 1503 06/14/23 2031 06/14/23 2313 06/15/23 0420 06/15/23 0901  K 3.8 4.0 3.5 4.2 2.9* 3.5  -Replete with po Kcl 60 mEQ x1 and IV Kcl 40 mEQ -Continue to Monitor and Replete as Necessary -Repeat CMP in the AM   Hypophosphatemia -Phos Level Trend: Recent Labs  Lab 06/15/23 0901  PHOS 1.3*  -Replete with IV Na+Phos 30 mmol -Continue to Monitor and Replete as Necessary -Repeat Phos Level in the AM  Hyponatremia Pseudohyponatremia  -Na+ Trend: Recent Labs  Lab 06/14/23 1140 06/14/23 1503 06/14/23 2031 06/14/23 2313 06/15/23 0420 06/15/23 0901  NA 130* 131* 133* 135 134* 136  -Continue to Monitor and Trend and repeat CMP in the AM  Normocytic Anemia -Hgb/Hct Trend: Recent Labs  Lab 06/14/23 1140 06/15/23 0420  HGB 14.7 11.9*  HCT 42.6 34.0*  MCV 91.0 90.7  -Likely Hemoconcentrated on Admission; Check Anemia Panel in the AM -Continue to Monitor for S/Sx of Bleeding; No overt bleeding noted -Repeat CBC in the AM  GERD/GI Prophylaxis -Given IV PPI with Pantoprazole 40 mg x1 and now started on po Pantoprazole 40 mg po Daily   Hypoalbuminemia -Patient's Albumin Trend: Recent Labs  Lab 06/14/23 1503 06/15/23 0420 06/15/23 0901  ALBUMIN 3.4* 3.1* 3.0*  -Continue to Monitor and Trend and repeat CMP in the AM  Overweight -Complicates overall prognosis and care -Estimated body mass index is 26.08 kg/m as calculated from the following:   Height as of this encounter: 5\' 11"  (1.803 m).   Weight as of this encounter: 84.8 kg.  -Weight  Loss and Dietary Counseling given

## 2023-06-15 NOTE — TOC Progression Note (Signed)
Transition of Care [facility]) - Progression Note    Patient Details  Name: Warren Cuevas MRN: [id number] Date of Birth: [birth year]  Transition of Care [facility]) CM/SW Contact  Geni Bers, RN Phone Number: 06/15/2023, 1:56 PM  Clinical Narrative:     Pt from home with spouse.    Barriers to Discharge: No Barriers Identified  Expected Discharge Plan and Services       Living arrangements for the past 2 months: Single Family Home                                       Social Determinants of Health (SDOH) Interventions SDOH Screenings   Food Insecurity: No Food Insecurity (06/14/2023)  Housing: Low Risk  (06/14/2023)  Transportation Needs: No Transportation Needs (06/14/2023)  Utilities: Not At Risk (06/14/2023)  Tobacco Use: Medium Risk (06/14/2023)    Readmission Risk Interventions     No data to display

## 2023-06-16 DIAGNOSIS — E1110 Type 2 diabetes mellitus with ketoacidosis without coma: Secondary | ICD-10-CM | POA: Diagnosis not present

## 2023-06-16 LAB — CBC WITH DIFFERENTIAL/PLATELET
Abs Immature Granulocytes: 0.02 10*3/uL (ref 0.00–0.07)
Basophils Absolute: 0 10*3/uL (ref 0.0–0.1)
Basophils Relative: 0 %
Eosinophils Absolute: 0 10*3/uL (ref 0.0–0.5)
Eosinophils Relative: 0 %
HCT: 37.9 % — ABNORMAL LOW (ref 39.0–52.0)
Hemoglobin: 11.6 g/dL — ABNORMAL LOW (ref 13.0–17.0)
Immature Granulocytes: 0 %
Lymphocytes Relative: 21 %
Lymphs Abs: 1.4 10*3/uL (ref 0.7–4.0)
MCH: 29.5 pg (ref 26.0–34.0)
MCHC: 30.6 g/dL (ref 30.0–36.0)
MCV: 96.4 fL (ref 80.0–100.0)
Monocytes Absolute: 0.7 10*3/uL (ref 0.1–1.0)
Monocytes Relative: 10 %
Neutro Abs: 4.5 10*3/uL (ref 1.7–7.7)
Neutrophils Relative %: 69 %
Platelets: 185 10*3/uL (ref 150–400)
RBC: 3.93 MIL/uL — ABNORMAL LOW (ref 4.22–5.81)
RDW: 14.4 % (ref 11.5–15.5)
WBC: 6.7 10*3/uL (ref 4.0–10.5)
nRBC: 0 % (ref 0.0–0.2)

## 2023-06-16 LAB — GLUCOSE, CAPILLARY
Glucose-Capillary: 175 mg/dL — ABNORMAL HIGH (ref 70–99)
Glucose-Capillary: 171 mg/dL — ABNORMAL HIGH (ref 70–99)
Glucose-Capillary: 143 mg/dL — ABNORMAL HIGH (ref 70–99)
Glucose-Capillary: 239 mg/dL — ABNORMAL HIGH (ref 70–99)

## 2023-06-16 LAB — BASIC METABOLIC PANEL
Anion gap: 15 (ref 5–15)
BUN: 8 mg/dL (ref 6–20)
CO2: 18 mmol/L — ABNORMAL LOW (ref 22–32)
Calcium: 9 mg/dL (ref 8.9–10.3)
Chloride: 104 mmol/L (ref 98–111)
Creatinine, Ser: 0.58 mg/dL — ABNORMAL LOW (ref 0.61–1.24)
GFR, Estimated: >60 mL/min (ref >60)
Glucose, Bld: 122 mg/dL — ABNORMAL HIGH (ref 70–99)
Potassium: 3.6 mmol/L (ref 3.5–5.1)
Sodium: 137 mmol/L (ref 135–145)

## 2023-06-16 LAB — COMPREHENSIVE METABOLIC PANEL
ALT: 13 U/L (ref 0–44)
AST: 11 U/L — ABNORMAL LOW (ref 15–41)
Albumin: 2.7 g/dL — ABNORMAL LOW (ref 3.5–5.0)
Alkaline Phosphatase: 56 U/L (ref 38–126)
Anion gap: 8 (ref 5–15)
BUN: 30 mg/dL — ABNORMAL HIGH (ref 6–20)
CO2: 28 mmol/L (ref 22–32)
Calcium: 8.2 mg/dL — ABNORMAL LOW (ref 8.9–10.3)
Chloride: 102 mmol/L (ref 98–111)
Creatinine, Ser: 1.48 mg/dL — ABNORMAL HIGH (ref 0.61–1.24)
GFR, Estimated: >60 mL/min (ref >60)
Glucose, Bld: 185 mg/dL — ABNORMAL HIGH (ref 70–99)
Potassium: 4.4 mmol/L (ref 3.5–5.1)
Sodium: 138 mmol/L (ref 135–145)
Total Bilirubin: 1.1 mg/dL (ref <1.2)
Total Protein: 5.9 g/dL — ABNORMAL LOW (ref 6.5–8.1)

## 2023-06-16 LAB — PHOSPHORUS: Phosphorus: 3 mg/dL (ref 2.5–4.6)

## 2023-06-16 LAB — MAGNESIUM: Magnesium: 2.2 mg/dL (ref 1.7–2.4)

## 2023-06-16 MED ORDER — SODIUM CHLORIDE 0.9 % IV SOLN: INTRAVENOUS | Status: AC

## 2023-06-16 MED ORDER — INSULIN GLARGINE-YFGN 100 UNIT/ML ~~LOC~~ SOLN: 15.0000 [IU] | Freq: Every day | SUBCUTANEOUS | Status: DC

## 2023-06-16 NOTE — Inpatient Diabetes Management (Signed)
Inpatient Diabetes Program Recommendations  AACE/ADA: New Consensus Statement on Inpatient Glycemic Control (2015)  Target Ranges:  Prepandial:   less than 140 mg/dL      Peak postprandial:   less than 180 mg/dL (1-2 hours)      Critically ill patients:  140 - 180 mg/dL   Lab Results  Component Value Date   GLUCAP 171 (H) 06/16/2023   HGBA1C 14.5 (H) 06/14/2023    Review of Glycemic Control  Current orders for Inpatient glycemic control: Semglee 15 daily, Novolog 0-9 units TID with meals and 0-5 HS + 4 units TID  Inpatient Diabetes Program Recommendations:    Discharge Recommendations: Other recommendations: F/U with PCP within a week or two and take logbook for review Long acting recommendations: Insulin Glargine (LANTUS) Solostar Pen 15 units QHS  Short acting recommendations:  Meal + Correction coverage Insulin aspart (NOVOLOG) FlexPen  Moderate Scale.  3 units TID with meals Supply/Referral recommendations: Glucometer Test strips Lancet device Lancets Pen needles - standard Referral to Nutrition & Diab Services   Use Adult Diabetes Insulin Treatment Post Discharge order set.  Spoke with pt at bedside regarding glucose monitoring, insulin injections, diet, exercise, hypoglycemia s/s and treatment, and f/u with PCP within a week or two. Pt seems to be doing well overall. Will need to give insulin injection prior to d/c. Discussed with RN.   Thank you. Ailene Ards, RD, LDN, CDCES Inpatient Diabetes Coordinator [phone]

## 2023-06-16 NOTE — Progress Notes (Signed)
Per RD/diabetes management, patient needs to self-administer insulin injection prior to DC. Patient successfully administered dose.

## 2023-06-16 NOTE — Plan of Care (Signed)

## 2023-06-16 NOTE — Progress Notes (Signed)
PROGRESS NOTE    Warren Cuevas  HYQ:[id number] DOB: 09/13/1980 DOA: 06/14/2023 PCP: Carin Hock, PA   Brief Narrative:  The patient is a 42 year old Caucasian male with a past medical history significant for but not limited to ADHD, GERD as well as other comorbidities who was sent to the ED by his primary care physician due to hyponatremia and hyperglycemia with an A1c of over 14 with several weeks of history of polyuria, polydipsia, blurred vision loss.  Found to have DKA and admitted and placed on insulin drip.   Today, pt denies any new complaints, eager to be discharged. Noted new AKI. Encouraged to drink orally and started IVF     Assessment and Plan:  DKA, type 2 (HCC) with Hyperglycemia A1c 14.5 S/p insulin drip, now transitioned to subcu insulin Continue SSI, semglee, novolog TID Mod carb diet Consult diabetes coordinator  AKI Cr jumped to 1.48 Denies any retention, reports not staying hydrated Encouraged oral intake in addition to starting IVF Daily bmp    ADHD (attention deficit hyperactivity disorder) -Hold Adderall today.   Elevated blood pressure reading Seems to be more controlled Will Consider starting an ACE inhibitor prior to D/C vs follow up with PCP   Normocytic Anemia Follow up with PCP   GERD/GI Prophylaxis Given IV PPI with Pantoprazole 40 mg x1 and now started on po Pantoprazole 40 mg po Daily    Hypoalbuminemia Monitor   Overweight Estimated body mass index is 26.08 kg/m as calculated from the following:   Height as of this encounter: 5\' 11"  (1.803 m).   Weight as of this encounter: 84.8 kg.  Weight Loss and Dietary Counseling given   DVT prophylaxis: enoxaparin (LOVENOX) injection 40 mg Start: 06/14/23 1445    Code Status: Full Code Family Communication: No family currently at bedside  Disposition Plan:  Level of care: Stepdown Status is: Inpatient The patient will require care spanning > 2 midnights and should be  moved to inpatient because: Continues to be on insulin drip and will need to be transition to long-acting insulin   Consultants:  Diabetes education coordinator  Procedures:  As delineated as above  Antimicrobials:  Anti-infectives (From admission, onward)    None         Objective: Vitals:   06/16/23 1200 06/16/23 1400 06/16/23 1500 06/16/23 1626  BP:  121/82    Pulse:  (!) 114 82 78  Resp:  (!) 24 (!) 21 16  Temp: (!) 97.5 F (36.4 C)  97.9 F (36.6 C)   TempSrc: Oral  Oral   SpO2:  100% 99% 98%  Weight:      Height:        Intake/Output Summary (Last 24 hours) at 06/16/2023 2004 Last data filed at 06/16/2023 1600 Gross per 24 hour  Intake 994.96 ml  Output 700 ml  Net 294.96 ml   Filed Weights   06/14/23 1126  Weight: 84.8 kg   Examination: Physical Exam:  General: NAD  Cardiovascular: S1, S2 present Respiratory: CTAB Abdomen: Soft, nontender, nondistended, bowel sounds present Musculoskeletal: No bilateral pedal edema noted Skin: Normal Psychiatry: Normal mood   Data Reviewed: I have personally reviewed following labs and imaging studies  CBC: Recent Labs  Lab 06/14/23 1140 06/15/23 0420 06/16/23 0335  WBC 6.0 5.6 6.7  NEUTROABS 3.1  --  4.5  HGB 14.7 11.9* 11.6*  HCT 42.6 34.0* 37.9*  MCV 91.0 90.7 96.4  PLT 250 206 185   Basic Metabolic Panel: Recent Labs  Lab 06/14/23 2313 06/15/23 0420 06/15/23 0901 06/15/23 1624 06/16/23 0335  NA 135 134* 136 133* 138  K 4.2 2.9* 3.5 3.1* 4.4  CL 111 107 108 103 102  CO2 15* 18* 21* 21* 28  GLUCOSE 148* 154* 130* 172* 185*  BUN 11 11 9 7  30*  CREATININE 0.60* 0.68 0.50* 0.68 1.48*  CALCIUM 8.4* 8.4* 8.4* 8.5* 8.2*  MG  --   --  2.0  --  2.2  PHOS  --   --  1.3*  --  3.0   GFR: Estimated Creatinine Clearance: 70 mL/min (A) (by C-G formula based on SCr of 1.48 mg/dL (H)). Liver Function Tests: Recent Labs  Lab 06/14/23 1503 06/15/23 0420 06/15/23 0901 06/15/23 1624 06/16/23 0335   AST 12* 10* 11* 13* 11*  ALT 11 11 12 15 13   ALKPHOS 78 52 51 55 56  BILITOT 1.1 0.8 1.1 0.9 1.1  PROT 6.2* 5.8* 5.6* 6.1* 5.9*  ALBUMIN 3.4* 3.1* 3.0* 3.3* 2.7*   Recent Labs  Lab 06/14/23 1257  LIPASE 35   No results for input(s): "AMMONIA" in the last 168 hours. Coagulation Profile: No results for input(s): "INR", "PROTIME" in the last 168 hours. Cardiac Enzymes: No results for input(s): "CKTOTAL", "CKMB", "CKMBINDEX", "TROPONINI" in the last 168 hours. BNP (last 3 results) No results for input(s): "PROBNP" in the last 8760 hours. HbA1C: Recent Labs    06/14/23 1503  HGBA1C 14.5*   CBG: Recent Labs  Lab 06/15/23 2041 06/15/23 2200 06/16/23 0743 06/16/23 1119 06/16/23 1611  GLUCAP 152* 221* 175* 171* 143*   Lipid Profile: No results for input(s): "CHOL", "HDL", "LDLCALC", "TRIG", "CHOLHDL", "LDLDIRECT" in the last 72 hours. Thyroid Function Tests: No results for input(s): "TSH", "T4TOTAL", "FREET4", "T3FREE", "THYROIDAB" in the last 72 hours. Anemia Panel: No results for input(s): "VITAMINB12", "FOLATE", "FERRITIN", "TIBC", "IRON", "RETICCTPCT" in the last 72 hours. Sepsis Labs: No results for input(s): "PROCALCITON", "LATICACIDVEN" in the last 168 hours.  Recent Results (from the past 240 hour(s))  MRSA Next Gen by PCR, Nasal     Status: None   Collection Time: 06/14/23  2:54 PM   Specimen: Nasal Mucosa; Nasal Swab  Result Value Ref Range Status   MRSA by PCR Next Gen NOT DETECTED NOT DETECTED Final    Comment: (NOTE) The GeneXpert MRSA Assay (FDA approved for NASAL specimens only), is one component of a comprehensive MRSA colonization surveillance program. It is not intended to diagnose MRSA infection nor to guide or monitor treatment for MRSA infections. Test performance is not FDA approved in patients less than 55 years old. Performed at [facility], [address] [place], [place] [address]     Radiology Studies: No results  found.  Scheduled Meds:  Chlorhexidine Gluconate Cloth  6 each Topical Daily   enoxaparin (LOVENOX) injection  40 mg Subcutaneous Daily   insulin aspart  0-5 Units Subcutaneous QHS   insulin aspart  0-9 Units Subcutaneous TID WC   insulin aspart  4 Units Subcutaneous TID WC   insulin glargine-yfgn  15 Units Subcutaneous Daily   pantoprazole  40 mg Oral Daily   Continuous Infusions:    LOS: 1 day   Briant Cedar, MD [facility] Available via Epic secure chat 7am-7pm After these hours, please refer to coverage provider listed on [email or web address] 06/16/2023, 8:04 PM

## 2023-06-17 ENCOUNTER — Encounter (HOSPITAL_COMMUNITY): Payer: Self-pay

## 2023-06-17 DIAGNOSIS — E1110 Type 2 diabetes mellitus with ketoacidosis without coma: Secondary | ICD-10-CM | POA: Diagnosis not present

## 2023-06-17 LAB — GLUCOSE, CAPILLARY: Glucose-Capillary: 201 mg/dL — ABNORMAL HIGH (ref 70–99)

## 2023-06-17 MED ORDER — PANTOPRAZOLE SODIUM 40 MG PO TBEC: 40.0000 mg | DELAYED_RELEASE_TABLET | Freq: Every day | ORAL | 0 refills | Status: AC

## 2023-06-17 MED ORDER — PEN NEEDLES 31G X 5 MM MISC: 1.0000 | Freq: Three times a day (TID) | 0 refills | Status: AC

## 2023-06-17 MED ORDER — LANCET DEVICE MISC: 1.0000 | Freq: Three times a day (TID) | 0 refills | Status: AC

## 2023-06-17 MED ORDER — BLOOD GLUCOSE MONITORING SUPPL DEVI: 1.0000 | Freq: Three times a day (TID) | 0 refills | Status: AC

## 2023-06-17 MED ORDER — LANCETS MISC: 1.0000 | Freq: Three times a day (TID) | 0 refills | Status: AC

## 2023-06-17 MED ORDER — INSULIN GLARGINE 100 UNIT/ML SOLOSTAR PEN: 15.0000 [IU] | PEN_INJECTOR | Freq: Every day | SUBCUTANEOUS | 0 refills | Status: AC

## 2023-06-17 MED ORDER — BLOOD GLUCOSE TEST VI STRP: 1.0000 | ORAL_STRIP | Freq: Three times a day (TID) | 0 refills | Status: AC

## 2023-06-17 MED ORDER — INSULIN ASPART 100 UNIT/ML FLEXPEN: 3.0000 [IU] | PEN_INJECTOR | Freq: Three times a day (TID) | SUBCUTANEOUS | 0 refills | Status: AC

## 2023-06-17 NOTE — Progress Notes (Signed)
Discharge education provided. All questions answered. All belongings returned. PIVs removed. Patient ambulated safely to main entrance accompanied by NT.

## 2023-06-17 NOTE — Discharge Summary (Signed)
Physician Discharge Summary   Patient: Warren Cuevas MRN: [id number] DOB: 04-16-1981  Admit date:     06/14/2023  Discharge date: 06/17/23  Discharge Physician: Briant Cedar   PCP: Carin Hock, PA   Recommendations at discharge:    Follow up with PCP  Discharge Diagnoses: Principal Problem:   DKA, type 2 (HCC) Active Problems:   Pseudohyponatremia   GERD (gastroesophageal reflux disease)   ADHD (attention deficit hyperactivity disorder)   Elevated blood pressure reading    Hospital Course: The patient is a 42 year old Caucasian male with a past medical history significant for but not limited to ADHD, GERD as well as other comorbidities who was sent to the ED by his primary care physician due to hyponatremia and hyperglycemia with an A1c of over 14 with several weeks of history of polyuria, polydipsia, blurred vision loss. Found to have DKA and admitted and placed on insulin drip. Pt was subsequently transitioned to subcu insulin.    Today, AKI has resolved, patient denied any new complaints. Very eager to be discharged    Assessment and Plan:  DKA, type 2 (HCC) with Hyperglycemia A1c 14.5 S/p insulin drip, now transitioned to subcu insulin D/c on lantus pen, novolog flex pen for TID and correction Mod carb diet advised, with lifestyle modification Ordered glucometer and supplies   AKI- resolved Cr jumped to 1.48-->0.58 S/p IVF  ADHD (attention deficit hyperactivity disorder) Continue home Adderall   Elevated blood pressure reading Seemed to be more controlled, may have been stress/anxiety from DKA Follow up with PCP and keep a BP log   Normocytic Anemia Follow up with PCP   GERD Pantoprazole 40 mg po Daily    Hypoalbuminemia Monitor, follow up with PCP   Overweight Estimated body mass index is 26.08 kg/m as calculated from the following:   Height as of this encounter: 5\' 11"  (1.803 m).   Weight as of this encounter: 84.8 kg.   Weight Loss and Dietary Counseling given    Consultants: None Procedures performed: None Disposition: Home Diet recommendation:  Carb modified diet   DISCHARGE MEDICATION: Allergies as of 06/17/2023   No Known Allergies      Medication List     TAKE these medications    amphetamine-dextroamphetamine 15 MG 24 hr capsule Commonly known as: ADDERALL XR Take 15 mg by mouth every morning.   amphetamine-dextroamphetamine 15 MG tablet Commonly known as: ADDERALL Take 1 tablet by mouth 2 (two) times daily.   Blood Glucose Monitoring Suppl Devi 1 each by Does not apply route 3 (three) times daily. May dispense any manufacturer covered by patient's insurance.   BLOOD GLUCOSE TEST STRIPS Strp 1 each by Does not apply route 3 (three) times daily. Use as directed to check blood sugar. May dispense any manufacturer covered by patient's insurance and fits patient's device.   clotrimazole 10 MG troche Commonly known as: MYCELEX Take 10 mg by mouth 3 (three) times daily.   insulin aspart 100 UNIT/ML FlexPen Commonly known as: NOVOLOG Inject 3 Units into the skin 3 (three) times daily with meals. If eating and Blood Glucose (BG) 80 or higher inject 3 units for meal coverage and add correction dose per scale. If not eating, correction dose only. BG <150= 0 unit; BG 150-200= 1 unit; BG 201-250= 3 unit; BG 251-300= 5 unit; BG 301-350= 7 unit; BG 351-400= 9 unit; BG >400= 11 unit and Call Primary Care.   insulin glargine 100 UNIT/ML Solostar Pen Commonly known as: LANTUS Inject  15 Units into the skin at bedtime. May substitute as needed per insurance.   Lancet Device Misc 1 each by Does not apply route 3 (three) times daily. May dispense any manufacturer covered by patient's insurance.   Lancets Misc 1 each by Does not apply route 3 (three) times daily. Use as directed to check blood sugar. May dispense any manufacturer covered by patient's insurance and fits patient's device.    nystatin cream Commonly known as: MYCOSTATIN Apply 1 Application topically 3 (three) times daily.   pantoprazole 40 MG tablet Commonly known as: PROTONIX Take 1 tablet (40 mg total) by mouth daily.   Pen Needles 31G X 5 MM Misc 1 each by Does not apply route 3 (three) times daily. May dispense any manufacturer covered by patient's insurance.        Follow-up Information     Carin Hock, [place]. Schedule an appointment as soon as possible for a visit in 1 week(s).   Specialty: Physician Assistant Contact information: [address] [place] [address] [phone]                Discharge Exam: Ceasar Mons Weights   06/14/23 1126  Weight: 84.8 kg   General: NAD  Cardiovascular: S1, S2 present Respiratory: CTAB Abdomen: Soft, nontender, nondistended, bowel sounds present Musculoskeletal: No bilateral pedal edema noted Skin: Normal Psychiatry: Normal mood   Condition at discharge: stable  The results of significant diagnostics from this hospitalization (including imaging, microbiology, ancillary and laboratory) are listed below for reference.   Imaging Studies: No results found.  Microbiology: Results for orders placed or performed during the hospital encounter of 06/14/23  MRSA Next Gen by PCR, Nasal     Status: None   Collection Time: 06/14/23  2:54 PM   Specimen: Nasal Mucosa; Nasal Swab  Result Value Ref Range Status   MRSA by PCR Next Gen NOT DETECTED NOT DETECTED Final    Comment: (NOTE) The GeneXpert MRSA Assay (FDA approved for NASAL specimens only), is one component of a comprehensive MRSA colonization surveillance program. It is not intended to diagnose MRSA infection nor to guide or monitor treatment for MRSA infections. Test performance is not FDA approved in patients less than 49 years old. Performed at [facility], [address] [place], [place] [address]     Labs: CBC: Recent Labs  Lab 06/14/23 1140  06/15/23 0420 06/16/23 0335  WBC 6.0 5.6 6.7  NEUTROABS 3.1  --  4.5  HGB 14.7 11.9* 11.6*  HCT 42.6 34.0* 37.9*  MCV 91.0 90.7 96.4  PLT 250 206 185   Basic Metabolic Panel: Recent Labs  Lab 06/15/23 0420 06/15/23 0901 06/15/23 1624 06/16/23 0335 06/16/23 1727  NA 134* 136 133* 138 137  K 2.9* 3.5 3.1* 4.4 3.6  CL 107 108 103 102 104  CO2 18* 21* 21* 28 18*  GLUCOSE 154* 130* 172* 185* 122*  BUN 11 9 7  30* 8  CREATININE 0.68 0.50* 0.68 1.48* 0.58*  CALCIUM 8.4* 8.4* 8.5* 8.2* 9.0  MG  --  2.0  --  2.2  --   PHOS  --  1.3*  --  3.0  --    Liver Function Tests: Recent Labs  Lab 06/14/23 1503 06/15/23 0420 06/15/23 0901 06/15/23 1624 06/16/23 0335  AST 12* 10* 11* 13* 11*  ALT 11 11 12 15 13   ALKPHOS 78 52 51 55 56  BILITOT 1.1 0.8 1.1 0.9 1.1  PROT 6.2* 5.8* 5.6* 6.1* 5.9*  ALBUMIN 3.4* 3.1* 3.0* 3.3* 2.7*   CBG: Recent Labs  Lab 06/16/23 0743 06/16/23 1119 06/16/23 1611 06/16/23 2131 06/17/23 0814  GLUCAP 175* 171* 143* 239* 201*    Discharge time spent: greater than 30 minutes.  Signed: Briant Cedar, MD [facility] 06/17/2023
